# Patient Record
Sex: Female | Born: 1995
Health system: Southern US, Community
[De-identification: ages and names within clinical notes are randomized; demographics above are authoritative.]

## PROBLEM LIST (undated history)

## (undated) DIAGNOSIS — M329 Systemic lupus erythematosus, unspecified: Secondary | ICD-10-CM

## (undated) DIAGNOSIS — I639 Cerebral infarction, unspecified: Secondary | ICD-10-CM

## (undated) DIAGNOSIS — Z862 Personal history of diseases of the blood and blood-forming organs and certain disorders involving the immune mechanism: Secondary | ICD-10-CM

## (undated) HISTORY — DX: Cerebral infarction, unspecified: I63.9

## (undated) HISTORY — PX: HERNIA REPAIR: SHX51

---

## 2018-10-25 ENCOUNTER — Encounter: Payer: Self-pay | Admitting: Hematology and Oncology

## 2018-10-25 ENCOUNTER — Telehealth: Payer: Self-pay | Admitting: *Deleted

## 2018-10-25 ENCOUNTER — Telehealth: Payer: Self-pay | Admitting: Hematology and Oncology

## 2018-10-25 NOTE — Telephone Encounter (Signed)
"  Dr. Lenell Antu [phone]), Rebecca Bright of [facility].  Requesting return call today to speak with hematologist about Rebecca Bright lab results and F/U.  Will not be in office tomorrow.  May call nurse extension [phone] or [phone] if no answer to reach me from clinic."        This nurse received for further information sent via in-basket to on-call provider of the day.  Message left for New Patient Coordinator.   Student has ITP and Lupus. Hematology referral made in September.

## 2018-10-25 NOTE — Telephone Encounter (Signed)
A new patient appt has been scheduled for the pt to see Dr. Pamelia Hoit on 3/5 at 345pm. Pt aware to arrive 30 minutes early. Letter mailed.

## 2018-11-14 NOTE — Progress Notes (Signed)
[place] CONSULT NOTE  Patient Care Team: Patient, No Pcp Per as PCP - General (General Practice)  CHIEF COMPLAINTS/PURPOSE OF CONSULTATION: History of ITP  HISTORY OF PRESENTING ILLNESS:  Rebecca Bright 23 y.o. female is here because of a history of ITP and lupus. She is a Regulatory affairs officer at [employer] and was referred by Dr. Olevia Bowens at [facility]. She presents to the clinic alone today. She was diagnosed with ITP in 10/2014 and was diagnosed with lupus prior to that. In 2016 she went to the [place] and when she returned to [place], where her parents were stationed in [employer], she had lab work done that showed her platelets were at 4,000. She had a bone marrow biopsy and was hospitalized for a week and given IVIG and put on prednisone, followed by Rituxan, which worked for 6 month intervals. She was switched to Dapsone in 2018 and has been on it since. She has had occasional relapses since 2016, where she has been given IVIG and prednisone, and her most recent relapse was 10/24/18 when she ran out of dapsone due to a lack of health insurance.   She moved to [facility] in 2017 when her rheumatologist in the [place] was reassigned and so were her parents. Her relapses occurred at [facility] at [facility]. She has previously been recommended a splenectomy, but is opposed to that due to compromises to the immune system. She denies any side effects of dapsone. She has not had any relapses of skin lupus. She notes petechiae occurs when her platelets are low. She reviewed her medication list with me.   I reviewed her records extensively and collaborated the history with the patient.  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  MEDICATIONS:  Current  Outpatient Medications  Medication Sig Dispense Refill  . dapsone 100 MG tablet Take 1 tablet (100 mg total) by mouth daily. 90 tablet 3   No current facility-administered medications for this visit.     REVIEW OF SYSTEMS:   Constitutional: Denies fevers, chills or abnormal night sweats Eyes: Denies blurriness of vision, double vision or watery eyes Ears, nose, mouth, throat, and face: Denies mucositis or sore throat Respiratory: Denies cough, dyspnea or wheezes Cardiovascular: Denies palpitation, chest discomfort or lower extremity swelling Gastrointestinal:  Denies nausea, heartburn or change in bowel habits Skin: Denies abnormal skin rashes Lymphatics: Denies new lymphadenopathy or easy bruising Neurological:Denies numbness, tingling or new weaknesses Behavioral/Psych: Mood is stable, no new changes  All other systems were reviewed with the patient and are negative.  PHYSICAL EXAMINATION: ECOG PERFORMANCE STATUS: 0 - Asymptomatic  Vitals:   11/15/18 1559  BP: 137/83  Pulse: 72  Resp: 17  Temp: 98.9 F (37.2 C)  SpO2: 100%   Filed Weights   11/15/18 1559  Weight: 80 lb 11.2 oz (36.6 kg)    GENERAL:alert, no distress and comfortable SKIN: skin color, texture, turgor are normal, no rashes or significant lesions EYES: normal, conjunctiva are pink and non-injected, sclera clear OROPHARYNX:no exudate, no erythema and lips, buccal mucosa, and tongue normal  NECK: supple, thyroid normal size, non-tender, without nodularity LYMPH:  no palpable lymphadenopathy in the cervical, axillary or inguinal LUNGS: clear to auscultation and percussion with normal breathing effort HEART: regular rate & rhythm  and no murmurs and no lower extremity edema ABDOMEN:abdomen soft, non-tender and normal bowel sounds Musculoskeletal:no cyanosis of digits and no clubbing  PSYCH: alert & oriented x 3 with fluent speech NEURO: no focal motor/sensory deficits  LABORATORY DATA:  I have reviewed the  data as listed No results found for: WBC, HGB, HCT, MCV, PLT No results found for: NA, K, CL, CO2  RADIOGRAPHIC STUDIES: I have personally reviewed the radiological reports and agreed with the findings in the report.  ASSESSMENT AND PLAN:  History of ITP ITP secondary to SLE Prior treatment: 1.  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  She had a bone marrow biopsy there and was treated with IVIG prednisone and rituximab. 2.  Frequent relapses every 6 months.  She required IVIG prednisone and Rituxan. 3.  Moved to [place] and relapsed 07/06/2015, 02/27/2016 at [facility] 02/27/2016: 4 days of Decadron and IVIG and hydroxyurea for lupus (platelet count of 12) followed by Rituxan 07/06/2015: Hospitalization for ITP: Steroids with IVIG 4.  Patient investigated and found dapsone 100 mg daily which has been preventing relapses of ITP. 5.  10/24/2018: Platelets 49 when she ran out of dapsone. 6.  Last platelet count 217 on 11/15/2018  Plan: Patient wishes to have a hematologist prescribe her dapsone and keep a watch on her platelet count. Her labs are free when they are done at her [place]. I gave her a prescription for CBC with differential which can be done at any time if she were to have petechiae. She will call us if there is any such problem with the lab results.  Return to clinic in 1 year for follow-up   All questions were answered. The patient knows to call the clinic with any problems, questions or concerns.   Nicholas Lose, MD 11/15/2018   I, Cloyde Reams Dorshimer, am acting as scribe for Nicholas Lose, MD.  I have reviewed the above documentation for accuracy and completeness, and I agree with the above.

## 2018-11-15 ENCOUNTER — Inpatient Hospital Stay: Payer: BLUE CROSS/BLUE SHIELD | Attending: Hematology and Oncology | Admitting: Hematology and Oncology

## 2018-11-15 ENCOUNTER — Other Ambulatory Visit: Payer: Self-pay | Admitting: *Deleted

## 2018-11-15 DIAGNOSIS — M329 Systemic lupus erythematosus, unspecified: Secondary | ICD-10-CM | POA: Diagnosis not present

## 2018-11-15 DIAGNOSIS — D693 Immune thrombocytopenic purpura: Secondary | ICD-10-CM | POA: Diagnosis present

## 2018-11-15 DIAGNOSIS — Z862 Personal history of diseases of the blood and blood-forming organs and certain disorders involving the immune mechanism: Secondary | ICD-10-CM

## 2018-11-15 MED ORDER — DAPSONE 100 MG PO TABS: 100.0000 mg | ORAL_TABLET | Freq: Every day | ORAL | 3 refills | Status: DC

## 2018-11-15 NOTE — Assessment & Plan Note (Signed)
ITP secondary to SLE Prior treatment: 1. 02/27/2016: 4 days of Decadron and IVIG and hydroxyurea for lupus (platelet count of 12) followed by Rituxan 2.  07/06/2015: Hospitalization for ITP: Steroids with IVIG  Lab review:

## 2019-01-24 NOTE — Progress Notes (Signed)
HEMATOLOGY-ONCOLOGY DOXIMITY VISIT PROGRESS NOTE  I connected with Rebecca Bright on 01/25/2019 at  9:15 AM EDT by Doximity video conference and verified that I am speaking with the correct person using two identifiers.  I discussed the limitations, risks, security and privacy concerns of performing an evaluation and management service by Doximity and the availability of in person appointments.  I also discussed with the patient that there may be a patient responsible charge related to this service. The patient expressed understanding and agreed to proceed.  Patient's Location: Home Physician Location: Clinic  CHIEF COMPLIANT: Follow-up of ITP and Lupus  INTERVAL HISTORY: Rebecca Bright is a 23 y.o. female with above-mentioned history of ITP and Lupus currently on dapsone. She established care at [place] on 12/11/18. She presents today over Doximity for follow-up.  Patient had blood work done recently which showed platelet count of 38.  We are asked to do a virtual visit with her to discuss her treatment options.  In April the platelet count was 39 and in May it has remained stable at 38.  Dr. Olevia Bowens called me yesterday and we are seeing her today to discuss her treatment options  REVIEW OF SYSTEMS:   Constitutional: Denies fevers, chills or abnormal weight loss Eyes: Denies blurriness of vision Ears, nose, mouth, throat, and face: Denies mucositis or sore throat Respiratory: Denies cough, dyspnea or wheezes Cardiovascular: Denies palpitation, chest discomfort Gastrointestinal:  Denies nausea, heartburn or change in bowel habits Skin: Denies abnormal skin rashes Lymphatics: Denies new lymphadenopathy or easy bruising Neurological:Denies numbness, tingling or new weaknesses Behavioral/Psych: Mood is stable, no new changes  Extremities: No lower extremity edema Breast: denies any pain or lumps or nodules in either breasts All other systems were reviewed with the patient and  are negative.  Observations/Objective:    Assessment Plan:  History of ITP ITP secondary to SLE Prior treatment: 1.  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  She had a bone marrow biopsy there and was treated with IVIG prednisone and rituximab. 2.  Frequent relapses every 6 months.  She required IVIG prednisone and Rituxan. 3.  Moved to [place] and relapsed 07/06/2015, 02/27/2016 at [facility] 02/27/2016: 4 days of Decadron and IVIG and hydroxyurea for lupus (platelet count of 12) followed by Rituxan 07/06/2015: Hospitalization for ITP: Steroids with IVIG 4.  Patient investigated and found dapsone 100 mg daily which has been preventing relapses of ITP. 5.  10/24/2018: Platelets 49 when she ran out of dapsone. 6.  Last platelet count 217 on 11/15/2018  ----------------------------------------------------------------------------------------------------------------------------------------------------------- Current treatment: Dapsone and Plaquenil Labs: 10/24/2018: Platelet count: 49 11/07/2018: Platelet count 264 11/15/2018: Platelet count 217 12/28/2018: Platelet count 39, hemoglobin 10.9, WBC 3.1, ANC 1023, rheumatoid factor positive, SSA antibody positive 01/24/2019: Platelet count 38, WBC count 3.8, hemoglobin 10.8  Relapse of ITP: Since the patient is staying in [place] and her platelet counts are not terrible, I recommended that we start her on dexamethasone and treat her for a week.  She has a prescription from me for a CBC to be drawn locally.  I would like to see her next week through the same virtual visit to determine if any additional treatments like IVIG or Rituxan would be necessary.  I instructed the patient to call us if she has any worsening bleeding symptoms. I sent a prescription for dexamethasone as well as Protonix because previously she had trouble with gastritis  from  dexamethasone.  I discussed the assessment and treatment plan with the patient. The patient was provided an opportunity to ask questions and all were answered. The patient agreed with the plan and demonstrated an understanding of the instructions. The patient was advised to call back or seek an in-person evaluation if the symptoms worsen or if the condition fails to improve as anticipated.   I provided 15 minutes of face-to-face Doximity time during this encounter.    Rulon Eisenmenger, MD 01/25/2019   I, Molly Dorshimer, am acting as scribe for Nicholas Lose, MD.  I have reviewed the above documentation for accuracy and completeness, and I agree with the above.

## 2019-01-25 ENCOUNTER — Inpatient Hospital Stay: Payer: BLUE CROSS/BLUE SHIELD | Attending: Hematology and Oncology | Admitting: Hematology and Oncology

## 2019-01-25 ENCOUNTER — Other Ambulatory Visit: Payer: Self-pay

## 2019-01-25 DIAGNOSIS — Z862 Personal history of diseases of the blood and blood-forming organs and certain disorders involving the immune mechanism: Secondary | ICD-10-CM

## 2019-01-25 DIAGNOSIS — D693 Immune thrombocytopenic purpura: Secondary | ICD-10-CM | POA: Diagnosis not present

## 2019-01-25 MED ORDER — PANTOPRAZOLE SODIUM 40 MG PO TBEC: 40.0000 mg | DELAYED_RELEASE_TABLET | Freq: Every day | ORAL | 0 refills | Status: DC

## 2019-01-25 MED ORDER — DEXAMETHASONE 4 MG PO TABS: 4.0000 mg | ORAL_TABLET | Freq: Every day | ORAL | 0 refills | Status: DC

## 2019-01-25 NOTE — Assessment & Plan Note (Signed)
ITP secondary to SLE Prior treatment: 1.  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  She had a bone marrow biopsy there and was treated with IVIG prednisone and rituximab. 2.  Frequent relapses every 6 months.  She required IVIG prednisone and Rituxan. 3.  Moved to [place] and relapsed 07/06/2015, 02/27/2016 at [facility] 02/27/2016: 4 days of Decadron and IVIG and hydroxyurea for lupus (platelet count of 12) followed by Rituxan 07/06/2015: Hospitalization for ITP: Steroids with IVIG 4.  Patient investigated and found dapsone 100 mg daily which has been preventing relapses of ITP. 5.  10/24/2018: Platelets 49 when she ran out of dapsone. 6.  Last platelet count 217 on 11/15/2018  ----------------------------------------------------------------------------------------------------------------------------------------------------------- Current treatment: Dapsone and Labs: 10/24/2018: Platelet count: 49 11/07/2018: Platelet count 264 11/15/2018: Platelet count 217 12/28/2018: Platelet count 39, hemoglobin 10.9, WBC 3.1, ANC 1023, rheumatoid factor positive, SSA antibody positive 01/24/2019: Platelet count 38, WBC count 3.8, hemoglobin 10.8  Relapse of ITP: I discussed with her that she would probably need IVIG and a 4 days of dexamethasone.

## 2019-01-29 NOTE — Assessment & Plan Note (Signed)
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 She had a bone marrow biopsy there and was treated with IVIG prednisone and rituximab. 2.Frequent relapses every 6 months. She required IVIG prednisone and Rituxan. 3.Moved to [place] and relapsed 07/06/2015, 02/27/2016 at [facility] 02/27/2016: 4 days of Decadron and IVIG and hydroxyurea for lupus(platelet count of 12)followed by Rituxan 07/06/2015: Hospitalization for ITP: Steroids with IVIG 4.Patient investigated and found dapsone 100 mg daily which has been preventing relapses of ITP. 5.10/24/2018: Platelets 49 when she ran out of dapsone. 6.Last platelet count 217 on 11/15/2018  ----------------------------------------------------------------------------------------------------------------------------------------------------------- Current treatment: Dapsone and Plaquenil Labs: 10/24/2018: Platelet count: 49 11/07/2018: Platelet count 264 11/15/2018: Platelet count 217 12/28/2018: Platelet count 39, hemoglobin 10.9, WBC 3.1, ANC 1023, rheumatoid factor positive, SSA antibody positive 01/24/2019: Platelet count 38, WBC count 3.8, hemoglobin 10.8  Relapse of ITP: Current treatment: Dexamethasone 4 mg daily started 01/25/2019  Lab review:  If no response, she will need to come in for IVIG

## 2019-01-31 ENCOUNTER — Telehealth: Payer: Self-pay | Admitting: Hematology and Oncology

## 2019-01-31 NOTE — Progress Notes (Signed)
HEMATOLOGY-ONCOLOGY DOXIMITY VISIT PROGRESS NOTE  I connected with Rebecca Bright on 02/01/2019 at 11:00 AM EDT by Doximity video conference and verified that I am speaking with the correct person using two identifiers.  I discussed the limitations, risks, security and privacy concerns of performing an evaluation and management service by Doximity and the availability of in person appointments.  I also discussed with the patient that there may be a patient responsible charge related to this service. The patient expressed understanding and agreed to proceed.  Patient's Location: Home Physician Location: Clinic  CHIEF COMPLIANT: Follow-up of ITP and Lupus  INTERVAL HISTORY: Rebecca Bright is a 23 y.o. female with above-mentioned history of ITP and Lupus currently on dapsone and dexamethasone after labs on 01/28/19 showed platelet count of 38. She presents today over Doximity for follow-up of dexamethasone treatment and to review recent lab work.  She has epigastric discomfort due to dexamethasone.  She has not had any bruising or bleeding problems. Labs from 01/31/2019 reveal a platelet count of 299.  Hemoglobin 10.6 with an MCV of 82.2.  REVIEW OF SYSTEMS:   Constitutional: Denies fevers, chills or abnormal weight loss Eyes: Denies blurriness of vision Ears, nose, mouth, throat, and face: Denies mucositis or sore throat Respiratory: Denies cough, dyspnea or wheezes Cardiovascular: Denies palpitation, chest discomfort Gastrointestinal: Mild abdominal discomfort due to steroids Skin: Denies abnormal skin rashes Lymphatics: Denies new lymphadenopathy or easy bruising Neurological:Denies numbness, tingling or new weaknesses Behavioral/Psych: Mood is stable, no new changes  Extremities: No lower extremity edema Breast: denies any pain or lumps or nodules in either breasts All other systems were reviewed with the patient and are negative.  Observations/Objective:  There were no vitals filed  for this visit. There is no height or weight on file to calculate BMI.  I have reviewed the data as listed No flowsheet data found.  No results found for: WBC, HGB, HCT, MCV, PLT, NEUTROABS    Assessment Plan:  Acute ITP ([place]) ITP secondary to SLE Prior treatment: 1.Original bleed diagnosed February 2016 clinically when she was stationed there 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 She had a bone marrow biopsy there and was treated with IVIG prednisone and rituximab. 2.Frequent relapses every 6 months. She required IVIG prednisone and Rituxan. 3.Moved to [place] and relapsed 07/06/2015, 02/27/2016 at [facility] 02/27/2016: 4 days of Decadron and IVIG and hydroxyurea for lupus(platelet count of 12)followed by Rituxan 07/06/2015: Hospitalization for ITP: Steroids with IVIG 4.Patient investigated and found dapsone 100 mg daily which has been preventing relapses of ITP. 5.10/24/2018: Platelets 49 when she ran out of dapsone. 6.Last platelet count 217 on 11/15/2018  ----------------------------------------------------------------------------------------------------------------------------------------------------------- Current treatment: Dapsone and Plaquenil Labs: 10/24/2018: Platelet count: 49 11/07/2018: Platelet count 264 11/15/2018: Platelet count 217 12/28/2018: Platelet count 39, hemoglobin 10.9, WBC 3.1, ANC 1023, rheumatoid factor positive, SSA antibody positive 01/24/2019: Platelet count 38, WBC count 3.8, hemoglobin 10.8  Relapse of ITP: Current treatment: Dexamethasone 4 mg daily started 01/25/2019  Lab review: 01/31/2019: Platelets 299  Since she had a fantastic response to dexamethasone, we will taper off dexamethasone 3 mg for a week 2 mg for a week 1 mg for a week and half a milligram for another week.  So she will taper off over 4 weeks.  I plan to see her back to a Doximity video call in 2 weeks with labs done ahead of time at [facility] in [place].  We will repeat labs 2 weeks from  that point and then once a month  I discussed the assessment and treatment plan with the patient. The patient was provided an opportunity to ask questions and all were answered. The patient agreed with the plan and demonstrated an understanding of the instructions. The patient was advised to call back or seek an in-person evaluation if the symptoms worsen or if the condition fails to improve as anticipated.   I provided 15 minutes of face-to-face Doximity time during this encounter.    Rulon Eisenmenger, MD 02/01/2019   I, Molly Dorshimer, am acting as scribe for Nicholas Lose, MD.  I have reviewed the above documentation for accuracy and completeness, and I agree with the above.

## 2019-01-31 NOTE — Telephone Encounter (Signed)
Contacted pt regarding webex appt and pre reg

## 2019-02-01 ENCOUNTER — Inpatient Hospital Stay (HOSPITAL_BASED_OUTPATIENT_CLINIC_OR_DEPARTMENT_OTHER): Payer: BLUE CROSS/BLUE SHIELD | Admitting: Hematology and Oncology

## 2019-02-01 DIAGNOSIS — D693 Immune thrombocytopenic purpura: Secondary | ICD-10-CM | POA: Diagnosis not present

## 2019-02-06 ENCOUNTER — Telehealth: Payer: Self-pay

## 2019-02-06 ENCOUNTER — Other Ambulatory Visit: Payer: Self-pay

## 2019-02-06 NOTE — Telephone Encounter (Signed)
Patient called stating pharmacy never received RX for dexamethasone taper prescription.   RN called in taper pack for dexamethasone per Dr. Earmon Phoenix recommendations.    Pt aware.  No further needs.

## 2019-02-18 ENCOUNTER — Other Ambulatory Visit: Payer: Self-pay | Admitting: Hematology and Oncology

## 2019-02-21 ENCOUNTER — Encounter: Payer: Self-pay | Admitting: Hematology and Oncology

## 2019-05-15 ENCOUNTER — Other Ambulatory Visit: Payer: Self-pay | Admitting: Emergency Medicine

## 2019-05-15 DIAGNOSIS — Z20822 Contact with and (suspected) exposure to covid-19: Secondary | ICD-10-CM

## 2019-05-16 LAB — NOVEL CORONAVIRUS, NAA: SARS-CoV-2, NAA: NOT DETECTED

## 2019-05-17 ENCOUNTER — Telehealth: Payer: Self-pay | Admitting: *Deleted

## 2019-05-17 ENCOUNTER — Telehealth: Payer: Self-pay

## 2019-05-17 NOTE — Telephone Encounter (Signed)
Records faxed to [facility] - Release [id number]

## 2019-05-17 NOTE — Telephone Encounter (Signed)
FYI "Dr. Olevia Bowens, [facility] [phone]).   Calling to discuss mutual patient Rebecca Bright with Dr. Lindi Adie.   ITP and Lupus.  Labs for rheumatologist resulted yesterday.   Pltc = 17.   She reports given Prednisone, Plaquenil and Dapsone but ran out of Prednisone."  Paged on-call provider to return call.

## 2019-05-17 NOTE — Telephone Encounter (Signed)
I discussed with Dr. Olevia Bowens, message sent to Dr. Lindi Adie

## 2019-05-17 NOTE — Telephone Encounter (Signed)
RN spoke with patient to schedule follow up with labs per Dr. Gearldine Shown recommendations.   Pt reports she currently had a COVID 19 test that was negative, however was still encouraged to quarantine for 2 weeks.    RN scheduled virtual visit for 9/8.  Pt will have Dr. Hansel Starling staff fax over copy of recent CBC.

## 2019-05-19 NOTE — Progress Notes (Signed)
HEMATOLOGY-ONCOLOGY DOXIMITY VISIT PROGRESS NOTE  I connected with Rebecca Bright on 05/21/2019 at  2:30 PM EDT by Doximity video conference and verified that I am speaking with the correct person using two identifiers.  I discussed the limitations, risks, security and privacy concerns of performing an evaluation and management service by Doximity and the availability of in person appointments.  I also discussed with the patient that there may be a patient responsible charge related to this service. The patient expressed understanding and agreed to proceed.  Patient's Location: Home Physician Location: Clinic  CHIEF COMPLIANT: Follow-up of ITP and Lupus  INTERVAL HISTORY: Rebecca Bright is a 23 y.o. female with above-mentioned history of ITP and Lupus currently on dapsone and dexamethasone. Labs on 05/16/19 at her rheumatologist showed platelets 17 and patient reported she had run out of dexamethasone. She presents today over Doximity for follow-up to review recent lab work.   REVIEW OF SYSTEMS:   Constitutional: Denies fevers, chills or abnormal weight loss Eyes: Denies blurriness of vision Ears, nose, mouth, throat, and face: Denies mucositis or sore throat Respiratory: Denies cough, dyspnea or wheezes Cardiovascular: Denies palpitation, chest discomfort Gastrointestinal:  Denies nausea, heartburn or change in bowel habits Skin: Denies abnormal skin rashes Lymphatics: Denies new lymphadenopathy or easy bruising Neurological:Denies numbness, tingling or new weaknesses Behavioral/Psych: Mood is stable, no new changes  Extremities: No lower extremity edema Breast: denies any pain or lumps or nodules in either breasts All other systems were reviewed with the patient and are negative.  Observations/Objective:  There were no vitals filed for this visit. There is no height or weight on file to calculate BMI.  I have reviewed the data as listed No flowsheet data found.  No results found  for: WBC, HGB, HCT, MCV, PLT, NEUTROABS    Assessment Plan:  Acute ITP ([place]) ITP secondary to SLE Prior treatment: 1.Original bleed diagnosed February 2016 clinically when she was stationed there 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 She had a bone marrow biopsy there and was treated with IVIG prednisone and rituximab. 2.Frequent relapses every 6 months. She required IVIG prednisone and Rituxan. 3.Moved to [place] and relapsed 07/06/2015, 02/27/2016 at [facility] 02/27/2016: 4 days of Decadron and IVIG and hydroxyurea for lupus(platelet count of 12)followed by Rituxan 07/06/2015: Hospitalization for ITP: Steroids with IVIG 4.Patient investigated and found dapsone 100 mg daily which has been preventing relapses of ITP. 5.10/24/2018: Platelets 49 when she ran out of dapsone. 6.Relapse 01/27/2019: Dexamethasone went into remission 7.  05/17/2019: Relapse platelet count 17 ----------------------------------------------------------------------------------------------------------------------------------------------------------- Current treatment: Dapsone andPlaquenil Dexamethasone started 01/25/2019: Remarkable response, tapered off.  Relapse: 05/17/2019: Platelet 17 Plan: Restart dexamethasone 4 mg daily for 1 week and then she will taper it off in the next 2 months. Because of such frequent relapses and remissions, we discussed different options and I recommended that we start Promacta.   Other options discussed include fostamatinib and Nplate  She will take 50 mg a day and I sent the prescription to [facility] long pharmacy to look at cost and other options.  I would like her to get labs done every other week for 2 months and then once a month for another 4 months.  She will do this at her student health center in [place].  We will do a video call in 4 weeks to go over the results and the plan.  I discussed the assessment and treatment plan with the  patient. The patient was provided an opportunity  to ask questions and all were answered. The patient agreed with the plan and demonstrated an understanding of the instructions. The patient was advised to call back or seek an in-person evaluation if the symptoms worsen or if the condition fails to improve as anticipated.   I provided 25 minutes of face-to-face Doximity time during this encounter.    Rulon Eisenmenger, MD 05/21/2019   I, Molly Dorshimer, am acting as scribe for Nicholas Lose, MD.  I have reviewed the above documentation for accuracy and completeness, and I agree with the above.

## 2019-05-21 ENCOUNTER — Inpatient Hospital Stay: Payer: BC Managed Care – PPO | Attending: Hematology and Oncology | Admitting: Hematology and Oncology

## 2019-05-21 DIAGNOSIS — D693 Immune thrombocytopenic purpura: Secondary | ICD-10-CM

## 2019-05-21 MED ORDER — ELTROMBOPAG OLAMINE 50 MG PO TABS: 50.0000 mg | ORAL_TABLET | Freq: Every day | ORAL | 6 refills | Status: DC

## 2019-05-21 MED ORDER — DEXAMETHASONE 4 MG PO TABS: 4.0000 mg | ORAL_TABLET | Freq: Every day | ORAL | 0 refills | Status: DC

## 2019-05-21 MED ORDER — DEXAMETHASONE 1 MG PO TABS: 4.0000 mg | ORAL_TABLET | Freq: Two times a day (BID) | ORAL | 0 refills | Status: DC

## 2019-05-21 NOTE — Assessment & Plan Note (Signed)
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 She had a bone marrow biopsy there and was treated with IVIG prednisone and rituximab. 2.Frequent relapses every 6 months. She required IVIG prednisone and Rituxan. 3.Moved to [place] and relapsed 07/06/2015, 02/27/2016 at [facility] 02/27/2016: 4 days of Decadron and IVIG and hydroxyurea for lupus(platelet count of 12)followed by Rituxan 07/06/2015: Hospitalization for ITP: Steroids with IVIG 4.Patient investigated and found dapsone 100 mg daily which has been preventing relapses of ITP. 5.10/24/2018: Platelets 49 when she ran out of dapsone. 6.Last platelet count 217 on 11/15/2018 ----------------------------------------------------------------------------------------------------------------------------------------------------------- Current treatment: Dapsone andPlaquenil Dexamethasone started 01/25/2019: Remarkable response, tapered off.  Relapse: 05/17/2019: Platelet 17 Plan: Restart dexamethasone

## 2019-05-22 ENCOUNTER — Telehealth: Payer: Self-pay | Admitting: Pharmacist

## 2019-05-22 ENCOUNTER — Telehealth: Payer: Self-pay

## 2019-05-22 ENCOUNTER — Telehealth: Payer: Self-pay | Admitting: Hematology and Oncology

## 2019-05-22 DIAGNOSIS — M329 Systemic lupus erythematosus, unspecified: Secondary | ICD-10-CM

## 2019-05-22 MED ORDER — DEXAMETHASONE 1 MG PO TABS: 4.0000 mg | ORAL_TABLET | Freq: Two times a day (BID) | ORAL | 0 refills | Status: DC

## 2019-05-22 MED ORDER — DEXAMETHASONE 4 MG PO TABS: 4.0000 mg | ORAL_TABLET | Freq: Every day | ORAL | 0 refills | Status: DC

## 2019-05-22 NOTE — Telephone Encounter (Signed)
I talk with patient about 10/6

## 2019-05-22 NOTE — Telephone Encounter (Signed)
Oral Oncology Patient Advocate Encounter  Received notification from [facility] that prior authorization for Promacta is required.  PA submitted on CoverMyMeds Key [id number] Status is pending  Oral Oncology Clinic will continue to follow.  [place] Patient [place] Phone [phone] Fax [phone] 05/22/2019    8:47 AM

## 2019-05-22 NOTE — Telephone Encounter (Signed)
Oral Chemotherapy Pharmacist Encounter   I spoke with patient for overview of: Promacta (eltrombopag) for the treatment of immune thrombocytopenia (ITP), planned duration until adequate platelet count response or unacceptable toxicity.   Counseled patient on administration, dosing, side effects, monitoring, drug-food interactions, safe handling, storage, and disposal.  Patient will take Promacta 50mg  tablets, 1 tablet by mouth once daily on an empty stomach, 1 hour before or 2 hours after a meal.  Patient counseled to administer Promacta at least 2 hours before, or 4 hours after antacids, foods high in calcium, or vitamin supplements (iron, calcium, aluminum, magnesium, selinium, zinc).  Promacta start date: 05/25/19  Adverse effects include but are not limited to: fatigue, diarrhea, nausea, pruritis, decreased blood counts, hepatotoxicity, flu-like symptoms, myalgias, fever, and peripheral edema.    Reviewed with patient importance of keeping a medication schedule and plan for any missed doses.  Medication reconciliation performed and medication/allergy list updated. Dexamethasone prescriptions sent to [facility] per patient's request.  Insurance authorization for [name] has been obtained. Test claim at the pharmacy revealed copayment $120 for 1st fill of Promacta. Oral oncology patient advocate has secured manufacturer copayment coupon to reduce out of pocket expense to $20 per fill.  This will ship from the [place] on 05/23/19 to deliver to patient's home on 9/11.  Patient informed the pharmacy will reach out 5-7 days prior to needing next fill of Promacta to coordinate continued medication acquisition to prevent break in therapy.  All questions answered.  Ms. Foulks voiced understanding and appreciation.   Patient knows to call the office with questions or concerns.  Johny Drilling, PharmD, BCPS, BCOP  05/22/2019   2:14 PM Oral Oncology  Clinic [phone]

## 2019-05-22 NOTE — Telephone Encounter (Signed)
Opened in error

## 2019-05-22 NOTE — Telephone Encounter (Signed)
Oral Oncology Patient Advocate Encounter  Prior Authorization for Rebecca Bright has been approved.    PA# [id number] Effective dates: 05/22/19 through 07/16/19  Oral Oncology Clinic will continue to follow.   [place] Patient [name] Phone [phone] Fax [phone] 05/22/2019    8:47 AM

## 2019-05-22 NOTE — Telephone Encounter (Signed)
Oral Oncology Pharmacist Encounter  Received new prescription for Promacta (eltrombopag) for the treatment of immune thrombocytopenia (ITP), planned duration until adequate platelet count response or unacceptable toxicity.  Originally diagnosed in 2016 with systemic lupus erythematosus and ITP Patient has been previously treated with IVIG, steroids, and rituximab, with recent relapse of disease She is maintained on dapsone and hydroxychloroquine for her SLE She is now under evaluation to initiate eltrombopag at 50 mg once daily for her ITP  Last labs I can find in Epic from 02/21/2019, no evidence of renal or hepatic dysfunction  Current medication list in Epic reviewed, no DDIs with eltrombopag identified.  Prescription has been e-scribed to the [facility] for benefits analysis and approval on 05/21/19 by MD.  Oral Oncology Clinic will continue to follow for insurance authorization, copayment issues, initial counseling and start date.  Johny Drilling, PharmD, BCPS, BCOP  05/22/2019 8:01 AM Oral Oncology Clinic [phone]

## 2019-05-23 MED FILL — PROMACTA 50 MG TABLET: 50 | 30 days supply | Qty: 30 | Fill #0

## 2019-05-24 NOTE — Telephone Encounter (Signed)
Oral Oncology Patient Advocate Encounter  Confirmed with [place] that Rebecca Bright was shipped on 9/10 with a $20 copay.   [place] Patient Rebecca Bright Phone [phone] Fax [phone] 05/24/2019   9:30 AM

## 2019-06-17 ENCOUNTER — Telehealth: Payer: Self-pay | Admitting: Hematology and Oncology

## 2019-06-17 MED FILL — PROMACTA 50 MG TABLET: 50 | 30 days supply | Qty: 30 | Fill #1

## 2019-06-17 NOTE — Progress Notes (Signed)
HEMATOLOGY-ONCOLOGY DOXIMITY VISIT PROGRESS NOTE  I connected with [name] on 06/18/2019 at  2:00 PM EDT by Doximityconference and verified that I am speaking with the correct person using two identifiers.  I discussed the limitations, risks, security and privacy concerns of performing an evaluation and management service by Doximity and the availability of in person appointments.  I also discussed with the patient that there may be a patient responsible charge related to this service. The patient expressed understanding and agreed to proceed.  Patient's Location: Home Physician Location: Clinic  CHIEF COMPLIANT: Follow-up of ITP and Lupus on Promacta  INTERVAL HISTORY: Rebecca Bright is a 23 y.o. female with above-mentioned history of ITP and Lupus currently on Promacta, started 05/25/19. She presents today over Doximity for follow-up.  Patient has been taking Promacta but has not done blood work because she was not feeling up to it.  She plans to go to her campus tomorrow and she will do blood work then.  We hope to receive the blood work the same day.  We do not know if the treatment is working at this time.  She has not had any problems tolerating Promacta.  REVIEW OF SYSTEMS:   Constitutional: Denies fevers, chills or abnormal weight loss Eyes: Denies blurriness of vision Ears, nose, mouth, throat, and face: Denies mucositis or sore throat Respiratory: Denies cough, dyspnea or wheezes Cardiovascular: Denies palpitation, chest discomfort Gastrointestinal:  Denies nausea, heartburn or change in bowel habits Skin: Denies abnormal skin rashes Lymphatics: Denies new lymphadenopathy or easy bruising Neurological:Denies numbness, tingling or new weaknesses Behavioral/Psych: Mood is stable, no new changes  Extremities: No lower extremity edema Breast: denies any pain or lumps or nodules in either breasts All other systems were reviewed with the patient and are  negative.  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 She had a bone marrow biopsy there and was treated with IVIG prednisone and rituximab. 2.Frequent relapses every 6 months. She required IVIG prednisone and Rituxan. 3.Moved to [place] and relapsed 07/06/2015, 02/27/2016 at [facility] 02/27/2016: 4 days of Decadron and IVIG and hydroxyurea for lupus(platelet count of 12)followed by Rituxan 07/06/2015: Hospitalization for ITP: Steroids with IVIG 4.Patient investigated and found dapsone 100 mg daily which has been preventing relapses of ITP. 5.10/24/2018: Platelets 49 when she ran out of dapsone. 6.Relapse 01/27/2019: Dexamethasone went into remission 7.  05/17/2019: Relapse platelet count 17 ----------------------------------------------------------------------------------------------------------------------------------------------------------- Relapse: 05/17/2019: Platelet 17 Plan: Dexamethasone 4 mg daily with Promacta 50 mg daily  She did not get her blood work done yet at her campus.  She will do it tomorrow.  She will do this at her student health center in [place].  Once I see the results we can discuss about treatment. We will perform a telephone visit after tomorrow.    I discussed the assessment and treatment plan with the patient. The patient was provided an opportunity to ask questions and all were answered. The patient agreed with the plan and demonstrated an understanding of the instructions. The patient was advised to call back or seek an in-person evaluation if the symptoms worsen or if the condition fails to improve as anticipated.   I provided 11 minutes of non face-to-face Doximity time during this encounter.    Rulon Eisenmenger, MD 06/18/2019  I, Molly Dorshimer, am acting as scribe for Nicholas Lose, MD.  I have reviewed the above documentation for accuracy and completeness, and I agree with the above.

## 2019-06-17 NOTE — Telephone Encounter (Signed)
Confirmed Doximity appt for 10/6 and verified pt's demographics

## 2019-06-18 ENCOUNTER — Inpatient Hospital Stay: Payer: BC Managed Care – PPO | Attending: Hematology and Oncology | Admitting: Hematology and Oncology

## 2019-06-18 DIAGNOSIS — D693 Immune thrombocytopenic purpura: Secondary | ICD-10-CM

## 2019-06-18 NOTE — Assessment & Plan Note (Signed)
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 She had a bone marrow biopsy there and was treated with IVIG prednisone and rituximab. 2.Frequent relapses every 6 months. She required IVIG prednisone and Rituxan. 3.Moved to [place] and relapsed 07/06/2015, 02/27/2016 at [facility] 02/27/2016: 4 days of Decadron and IVIG and hydroxyurea for lupus(platelet count of 12)followed by Rituxan 07/06/2015: Hospitalization for ITP: Steroids with IVIG 4.Patient investigated and found dapsone 100 mg daily which has been preventing relapses of ITP. 5.10/24/2018: Platelets 49 when she ran out of dapsone. 6.Relapse 01/27/2019: Dexamethasone went into remission 7.  05/17/2019: Relapse platelet count 17 ----------------------------------------------------------------------------------------------------------------------------------------------------------- Current treatment: Dapsone andPlaquenil Dexamethasone started 01/25/2019: Remarkable response, tapered off.  Relapse: 05/17/2019: Platelet 17 Plan: Dexamethasone 4 mg daily with Promacta 50 mg daily  I would like her to get labs done every other week for 2 months and then once a month for another 4 months.  She will do this at her student health center in [place]

## 2019-06-19 ENCOUNTER — Telehealth: Payer: Self-pay | Admitting: Hematology and Oncology

## 2019-06-19 NOTE — Telephone Encounter (Signed)
I LEFT  A message regarding phone visit  

## 2019-06-20 ENCOUNTER — Telehealth: Payer: Self-pay | Admitting: Hematology and Oncology

## 2019-06-20 ENCOUNTER — Inpatient Hospital Stay: Payer: BC Managed Care – PPO | Admitting: Hematology and Oncology

## 2019-06-20 NOTE — Telephone Encounter (Signed)
Verified phone visit for 10/9 and verified demographics

## 2019-06-20 NOTE — Progress Notes (Signed)
HEMATOLOGY-ONCOLOGY TELEPHONE VISIT PROGRESS NOTE  I connected with Rebecca Bright on 06/21/2019 at  8:30 AM EDT by telephone and verified that I am speaking with the correct person using two identifiers.  I discussed the limitations, risks, security and privacy concerns of performing an evaluation and management service by telephone and the availability of in person appointments.  I also discussed with the patient that there may be a patient responsible charge related to this service. The patient expressed understanding and agreed to proceed.   History of Present Illness: Rebecca Bright is a 23 y.o. female with above-mentioned history of ITP and Lupus currently onPromacta, started 05/25/19. She presents today over the phone today for follow-up to review her labs.  She does not have any bruising or bleeding symptoms. Observations/Objective:  No concerns for bleeding   Assessment Plan:  Acute ITP ([place]) ITP secondary to SLE Prior treatment: 1.Original bleed diagnosed February 2016 clinically when she was stationed there 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 She had a bone marrow biopsy there and was treated with IVIG prednisone and rituximab. 2.Frequent relapses every 6 months. She required IVIG prednisone and Rituxan. 3.Moved to [place] and relapsed 07/06/2015, 02/27/2016 at [facility] 02/27/2016: 4 days of Decadron and IVIG and hydroxyurea for lupus(platelet count of 12)followed by Rituxan 07/06/2015: Hospitalization for ITP: Steroids with IVIG 4.Patient investigated and found dapsone 100 mg daily which has been preventing relapses of ITP. 5.10/24/2018: Platelets 49 when she ran out of dapsone. 6.Relapse 01/27/2019: Dexamethasone went into remission 7.05/17/2019: Relapse platelet count 17 ----------------------------------------------------------------------------------------------------------------------------------------------------------- Relapse:  05/17/2019: Platelet 17 Current treatment: Dexamethasone4 mg daily with Promacta 50 mg daily  Lab review at [facility]: 06/20/2019: Platelet count 788 Plan: 1.  Start tapering dexamethasone for the next 3 weeks 2.  Stop Promacta until platelet count drops below 150 and then resume Promacta at 25 mg dose.  If in 2 weeks platelet counts are still above 400 then we may have to discontinue Promacta. 3.  She will discontinue dapsone as well. I will touched base with her in 2 weeks to review the results of blood work.  I discussed the assessment and treatment plan with the patient. The patient was provided an opportunity to ask questions and all were answered. The patient agreed with the plan and demonstrated an understanding of the instructions. The patient was advised to call back or seek an in-person evaluation if the symptoms worsen or if the condition fails to improve as anticipated.   I provided 15 minutes of non-face-to-face time during this encounter.   Rulon Eisenmenger, MD 06/21/2019    I, Molly Dorshimer, am acting as scribe for Nicholas Lose, MD.  I have reviewed the above documentation for accuracy and completeness, and I agree with the above.

## 2019-06-20 NOTE — Telephone Encounter (Signed)
Returned patient's phone call, patient confirmed date and time.

## 2019-06-21 ENCOUNTER — Inpatient Hospital Stay: Payer: BC Managed Care – PPO | Admitting: Hematology and Oncology

## 2019-06-21 DIAGNOSIS — D693 Immune thrombocytopenic purpura: Secondary | ICD-10-CM

## 2019-06-21 NOTE — Assessment & Plan Note (Signed)
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 She had a bone marrow biopsy there and was treated with IVIG prednisone and rituximab. 2.Frequent relapses every 6 months. She required IVIG prednisone and Rituxan. 3.Moved to [place] and relapsed 07/06/2015, 02/27/2016 at [facility] 02/27/2016: 4 days of Decadron and IVIG and hydroxyurea for lupus(platelet count of 12)followed by Rituxan 07/06/2015: Hospitalization for ITP: Steroids with IVIG 4.Patient investigated and found dapsone 100 mg daily which has been preventing relapses of ITP. 5.10/24/2018: Platelets 49 when she ran out of dapsone. 6.Relapse 01/27/2019: Dexamethasone went into remission 7.05/17/2019: Relapse platelet count 17 ----------------------------------------------------------------------------------------------------------------------------------------------------------- Relapse: 05/17/2019: Platelet 17 Current treatment: Dexamethasone4 mg daily with Promacta 50 mg daily  Lab review at [facility]:

## 2019-06-24 ENCOUNTER — Telehealth: Payer: Self-pay | Admitting: Hematology and Oncology

## 2019-06-24 NOTE — Telephone Encounter (Signed)
I TALK WITH PATIENT

## 2019-07-01 ENCOUNTER — Inpatient Hospital Stay: Payer: BC Managed Care – PPO | Admitting: Hematology and Oncology

## 2019-07-10 NOTE — Progress Notes (Signed)
HEMATOLOGY-ONCOLOGY DOXIMITY VISIT PROGRESS NOTE  I connected with [name] on 07/11/2019 at  8:15 AM EDT by Doximity video conference and verified that I am speaking with the correct person using two identifiers.  I discussed the limitations, risks, security and privacy concerns of performing an evaluation and management service by Doximity and the availability of in person appointments.  I also discussed with the patient that there may be a patient responsible charge related to this service. The patient expressed understanding and agreed to proceed.  Patient's Location: Home Physician Location: Clinic  CHIEF COMPLIANT: Follow-up of ITP and Lupus to review labs   INTERVAL HISTORY: Rebecca Bright is a 23 y.o. female with above-mentioned history of  ITP and Lupus currently onPromacta, started 05/25/19. She presents today overDoximity todayfor follow-up to review her labs.   REVIEW OF SYSTEMS:   Constitutional: Denies fevers, chills or abnormal weight loss Eyes: Denies blurriness of vision Ears, nose, mouth, throat, and face: Denies mucositis or sore throat Respiratory: Denies cough, dyspnea or wheezes Cardiovascular: Denies palpitation, chest discomfort Gastrointestinal:  Denies nausea, heartburn or change in bowel habits Skin: Denies abnormal skin rashes Lymphatics: Denies new lymphadenopathy or easy bruising Neurological:Denies numbness, tingling or new weaknesses Behavioral/Psych: Mood is stable, no new changes  Extremities: No lower extremity edema Breast: denies any pain or lumps or nodules in either breasts All other systems were reviewed with the patient and are negative.  Observations/Objective:  There were no vitals filed for this visit. There is no height or weight on file to calculate BMI.  I have reviewed the data as listed No flowsheet data found.  No results found for: WBC, HGB, HCT, MCV, PLT, NEUTROABS    Assessment Plan:  Acute ITP ([place]) ITP secondary  to SLE Prior treatment: 1.Original bleed diagnosed February 2016 clinically when she was stationed there 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 She had a bone marrow biopsy there and was treated with IVIG prednisone and rituximab. 2.Frequent relapses every 6 months. She required IVIG prednisone and Rituxan. 3.Moved to [place] and relapsed 07/06/2015, 02/27/2016 at [facility] 02/27/2016: 4 days of Decadron and IVIG and hydroxyurea for lupus(platelet count of 12)followed by Rituxan 07/06/2015: Hospitalization for ITP: Steroids with IVIG 4.Patient investigated and found dapsone 100 mg daily which has been preventing relapses of ITP. 5.10/24/2018: Platelets 49 when she ran out of dapsone. 6.Relapse 01/27/2019: Dexamethasone went into remission 7.05/17/2019: Relapse platelet count 17 ----------------------------------------------------------------------------------------------------------------------------------------------------------- Relapse: 05/17/2019: Platelet 17 Current treatment:Dexamethasone4 mg dailywith Promacta 50 mg daily  Lab review at [facility]:  06/20/2019: Platelet count 788 07/10/2019: Platelet count 404  Plan: 1.  Tapering dexamethasone 2.  Stop Promacta until platelet count drops below 150 and then resume Promacta at 25 mg dose.    I will touched base with her in 3 weeks to review the results of blood work.    I discussed the assessment and treatment plan with the patient. The patient was provided an opportunity to ask questions and all were answered. The patient agreed with the plan and demonstrated an understanding of the instructions. The patient was advised to call back or seek an in-person evaluation if the symptoms worsen or if the condition fails to improve as anticipated.   I provided 11 minutes of face-to-face Doximity time during this encounter.    Rulon Eisenmenger, MD 07/11/2019   I, Molly Dorshimer, am acting as scribe  for Nicholas Lose, MD.  I have reviewed the above documentation for accuracy and  completeness, and I agree with the above.

## 2019-07-10 NOTE — Assessment & Plan Note (Signed)
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 She had a bone marrow biopsy there and was treated with IVIG prednisone and rituximab. 2.Frequent relapses every 6 months. She required IVIG prednisone and Rituxan. 3.Moved to [place] and relapsed 07/06/2015, 02/27/2016 at [facility] 02/27/2016: 4 days of Decadron and IVIG and hydroxyurea for lupus(platelet count of 12)followed by Rituxan 07/06/2015: Hospitalization for ITP: Steroids with IVIG 4.Patient investigated and found dapsone 100 mg daily which has been preventing relapses of ITP. 5.10/24/2018: Platelets 49 when she ran out of dapsone. 6.Relapse 01/27/2019: Dexamethasone went into remission 7.05/17/2019: Relapse platelet count 17 ----------------------------------------------------------------------------------------------------------------------------------------------------------- Relapse: 05/17/2019: Platelet 17 Current treatment:Dexamethasone4 mg dailywith Promacta 50 mg daily  Lab review at [facility]:  06/20/2019: Platelet count 788 07/10/2019: Platelet count  Plan: 1.  Tapering dexamethasone 2.  Stop Promacta until platelet count drops below 150 and then resume Promacta at 25 mg dose.  If in 2 weeks platelet counts are still above 400 then we may have to discontinue Promacta.  I will touched base with her in 2 weeks to review the results of blood work.

## 2019-07-11 ENCOUNTER — Inpatient Hospital Stay (HOSPITAL_BASED_OUTPATIENT_CLINIC_OR_DEPARTMENT_OTHER): Payer: BC Managed Care – PPO | Admitting: Hematology and Oncology

## 2019-07-11 DIAGNOSIS — D693 Immune thrombocytopenic purpura: Secondary | ICD-10-CM

## 2019-07-12 ENCOUNTER — Telehealth: Payer: Self-pay | Admitting: Hematology and Oncology

## 2019-07-12 NOTE — Telephone Encounter (Signed)
Scheduled appt per 10/29 LOS - unable to reach pt - left message with appt date and time

## 2019-07-15 ENCOUNTER — Telehealth: Payer: Self-pay

## 2019-07-15 NOTE — Telephone Encounter (Signed)
Pt called to report increase in bruising and what she quoted "[place]".  Patient denies any bleeding in nares, gums, or stool.    RN reviewed with MD.  MD recommendations to obtain CBC and follow up in person.   Pt gets labs drawn at [facility], and is scheduled for 11/3 @ 9:40am.  Follow up with MD is scheduled for 11/3 @ 10:30.    RN educated patient on thrombocytopenia precautions.  Pt voiced understanding.

## 2019-07-15 NOTE — Progress Notes (Signed)
Patient Care Team: Keith Rake, MD as PCP - General (Family Medicine)  DIAGNOSIS:    ICD-10-CM   1. Acute ITP (HCC)  D69.3   2. Lupus (HCC)  M32.9 dexamethasone (DECADRON) 1 MG tablet    CHIEF COMPLIANT: Follow-up of ITP and Lupus for recent bruising and petechiae  INTERVAL HISTORY: Rebecca Bright is a 23 y.o. with above-mentioned history of ITP and Lupus currently onPromacta, started 05/25/19, and dexamethasone. She presents to the clinic today for follow-up of recent increased bruising and petechiae.  Her platelet count at the [facility] was 3.  She has noticed bleeding gums and extensive petechiae and bruising.  REVIEW OF SYSTEMS:   Constitutional: Denies fevers, chills or abnormal weight loss Eyes: Denies blurriness of vision Ears, nose, mouth, throat, and face: Bleeding mucosa in the mouth Respiratory: Denies cough, dyspnea or wheezes Cardiovascular: Denies palpitation, chest discomfort Gastrointestinal: Denies nausea, heartburn or change in bowel habits Skin: Denies abnormal skin rashes Lymphatics: Denies new lymphadenopathy or easy bruising Neurological: Denies numbness, tingling or new weaknesses Behavioral/Psych: Mood is stable, no new changes  Extremities: Extensive bruises on extremities Breast: denies any pain or lumps or nodules in either breasts All other systems were reviewed with the patient and are negative.  I have reviewed the past medical history, past surgical history, social history and family history with the patient and they are unchanged from previous note.  ALLERGIES:  has no allergies on file.  MEDICATIONS:  Current Outpatient Medications  Medication Sig Dispense Refill   dexamethasone (DECADRON) 1 MG tablet Take 4 tablets (4 mg total) by mouth 2 (two) times daily with a meal. 100 tablet 0   eltrombopag (PROMACTA) 50 MG tablet Take 0.5 tablets (25 mg total) by mouth daily. Take on an empty stomach 1 hour before a meal or 2 hours after 30 tablet 6    hydroxychloroquine (PLAQUENIL) 200 MG tablet Take 1 tablet (200 mg total) by mouth daily.     No current facility-administered medications for this visit.     PHYSICAL EXAMINATION: ECOG PERFORMANCE STATUS: 1 - Symptomatic but completely ambulatory  Vitals:   07/16/19 1049  BP: 135/86  Pulse: 75  Resp: 18  Temp: 98.3 F (36.8 C)  SpO2: 100%   Filed Weights   07/16/19 1049  Weight: 95 lb 3.2 oz (43.2 kg)    GENERAL: alert, no distress and comfortable SKIN: skin color, texture, turgor are normal, no rashes or significant lesions EYES: normal, Conjunctiva are pink and non-injected, sclera clear OROPHARYNX: no exudate, no erythema and lips, buccal mucosa, and tongue normal  NECK: supple, thyroid normal size, non-tender, without nodularity LYMPH: no palpable lymphadenopathy in the cervical, axillary or inguinal LUNGS: clear to auscultation and percussion with normal breathing effort HEART: regular rate & rhythm and no murmurs and no lower extremity edema ABDOMEN: abdomen soft, non-tender and normal bowel sounds MUSCULOSKELETAL: no cyanosis of digits and no clubbing  NEURO: alert & oriented x 3 with fluent speech, no focal motor/sensory deficits EXTREMITIES: No lower extremity edema  ASSESSMENT & PLAN:  Acute ITP (HCC) ITP secondary to SLE Prior treatment: 1.Original bleed diagnosed February 2016 clinically when she was stationed there 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 She had a bone marrow biopsy there and was treated with IVIG prednisone and rituximab. 2.Frequent relapses every 6 months. She required IVIG prednisone and Rituxan. 3.Moved to [place] and relapsed 07/06/2015, 02/27/2016 at [facility] 02/27/2016: 4 days of Decadron and IVIG and  hydroxyurea for lupus(platelet count of 12)followed by Rituxan 07/06/2015: Hospitalization for ITP: Steroids with IVIG 4.Patient investigated and found dapsone 100 mg daily which has been preventing  relapses of ITP. 5.10/24/2018: Platelets 49 when she ran out of dapsone. 6.Relapse 01/27/2019: Dexamethasone went into remission 7.05/17/2019: Relapse platelet count 17 ----------------------------------------------------------------------------------------------------------------------------------------------------------- Relapse: 05/17/2019: Platelet 17 Current treatment:Dexamethasone4 mg dailywith Promacta 50 mg daily held 06/20/2019 when platelet counts were 788  Lab review at [facility]: 06/20/2019: Platelet count 788 07/10/2019: Platelet count 404 07/15/2019: Platelet count 3  Plan: Restart Promacta 25 mg daily  Dexamethasone 4 mg daily with a plan to taper it off in a month Hold off on platelet transfusions She will need weekly lab checks.  [facility] I will discuss the lab results with her through a telephone visit in 1 week  No orders of the defined types were placed in this encounter.  The patient has a good understanding of the overall plan. she agrees with it. she will call with any problems that may develop before the next visit here.  Nicholas Lose, MD 07/16/2019  Julious Oka Dorshimer am acting as scribe for Dr. Nicholas Lose.  I have reviewed the above documentation for accuracy and completeness, and I agree with the above.

## 2019-07-16 ENCOUNTER — Other Ambulatory Visit: Payer: Self-pay

## 2019-07-16 ENCOUNTER — Inpatient Hospital Stay: Payer: BC Managed Care – PPO | Attending: Hematology and Oncology | Admitting: Hematology and Oncology

## 2019-07-16 ENCOUNTER — Telehealth: Payer: Self-pay | Admitting: Hematology and Oncology

## 2019-07-16 DIAGNOSIS — K068 Other specified disorders of gingiva and edentulous alveolar ridge: Secondary | ICD-10-CM | POA: Diagnosis not present

## 2019-07-16 DIAGNOSIS — D693 Immune thrombocytopenic purpura: Secondary | ICD-10-CM | POA: Insufficient documentation

## 2019-07-16 DIAGNOSIS — M329 Systemic lupus erythematosus, unspecified: Secondary | ICD-10-CM | POA: Insufficient documentation

## 2019-07-16 MED ORDER — ELTROMBOPAG OLAMINE 50 MG PO TABS: 25.0000 mg | ORAL_TABLET | Freq: Every day | ORAL | 6 refills | Status: DC

## 2019-07-16 MED ORDER — HYDROXYCHLOROQUINE SULFATE 200 MG PO TABS: 200.0000 mg | ORAL_TABLET | Freq: Every day | ORAL | Status: DC

## 2019-07-16 MED ORDER — DEXAMETHASONE 1 MG PO TABS: 4.0000 mg | ORAL_TABLET | Freq: Two times a day (BID) | ORAL | 0 refills | Status: DC

## 2019-07-16 NOTE — Assessment & Plan Note (Signed)
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 She had a bone marrow biopsy there and was treated with IVIG prednisone and rituximab. 2.Frequent relapses every 6 months. She required IVIG prednisone and Rituxan. 3.Moved to [place] and relapsed 07/06/2015, 02/27/2016 at [facility] 02/27/2016: 4 days of Decadron and IVIG and hydroxyurea for lupus(platelet count of 12)followed by Rituxan 07/06/2015: Hospitalization for ITP: Steroids with IVIG 4.Patient investigated and found dapsone 100 mg daily which has been preventing relapses of ITP. 5.10/24/2018: Platelets 49 when she ran out of dapsone. 6.Relapse 01/27/2019: Dexamethasone went into remission 7.05/17/2019: Relapse platelet count 17 ----------------------------------------------------------------------------------------------------------------------------------------------------------- Relapse: 05/17/2019: Platelet 17 Current treatment:Dexamethasone4 mg dailywith Promacta 50 mg daily held 06/20/2019 when platelet counts were 788  Lab review at [facility]: 06/20/2019: Platelet count 788 07/10/2019: Platelet count 404 07/15/2019: Platelet count  Plan: Restart Promacta 25 mg daily

## 2019-07-16 NOTE — Telephone Encounter (Signed)
PATIENTS already had appt per 11/3 los

## 2019-07-23 ENCOUNTER — Ambulatory Visit: Payer: BC Managed Care – PPO | Admitting: Hematology and Oncology

## 2019-08-01 ENCOUNTER — Inpatient Hospital Stay: Payer: BC Managed Care – PPO | Admitting: Hematology and Oncology

## 2019-08-01 DIAGNOSIS — D693 Immune thrombocytopenic purpura: Secondary | ICD-10-CM

## 2019-08-01 NOTE — Assessment & Plan Note (Signed)
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 She had a bone marrow biopsy there and was treated with IVIG prednisone and rituximab. 2.Frequent relapses every 6 months. She required IVIG prednisone and Rituxan. 3.Moved to [place] and relapsed 07/06/2015, 02/27/2016 at [facility] 02/27/2016: 4 days of Decadron and IVIG and hydroxyurea for lupus(platelet count of 12)followed by Rituxan 07/06/2015: Hospitalization for ITP: Steroids with IVIG 4.Patient investigated and found dapsone 100 mg daily which has been preventing relapses of ITP. 5.10/24/2018: Platelets 49 when she ran out of dapsone. 6.Relapse 01/27/2019: Dexamethasone went into remission 7.05/17/2019: Relapse platelet count 17 ----------------------------------------------------------------------------------------------------------------------------------------------------------- Relapse: 05/17/2019: Platelet 17 Current treatment:Dexamethasone4 mg dailywith Promacta 50 mg daily held 06/20/2019 when platelet counts were 788  Lab review at [facility]: 06/20/2019: Platelet count 788 07/10/2019: Platelet count404 07/15/2019: Platelet count 3  Plan: Restarted Promacta 25 mg daily 07/16/19 19 of Dexamethasone 4 mg daily with a plan to taper it off in a month

## 2019-08-07 ENCOUNTER — Telehealth: Payer: Self-pay | Admitting: Hematology and Oncology

## 2019-08-07 NOTE — Telephone Encounter (Signed)
I discussed with the patient that the platelet count was 32. She does not have any petechiae anymore.  No bruising or bleeding. We will keep the dosage of Promacta the same. She will be getting a blood count check next week and we will touch base with her at that time.

## 2019-08-20 ENCOUNTER — Telehealth: Payer: Self-pay | Admitting: Hematology and Oncology

## 2019-08-20 NOTE — Telephone Encounter (Signed)
08/20/2019: Platelet count 491 Instruction: Promacta 25 mg every other day Recheck CBC in 2 weeks We will call her once we see the results of her labs.

## 2019-09-23 ENCOUNTER — Other Ambulatory Visit: Payer: Self-pay | Admitting: Hematology and Oncology

## 2019-09-23 DIAGNOSIS — M329 Systemic lupus erythematosus, unspecified: Secondary | ICD-10-CM

## 2019-09-23 MED ORDER — DEXAMETHASONE 4 MG PO TABS: 4.0000 mg | ORAL_TABLET | Freq: Every day | ORAL | 1 refills | Status: DC

## 2019-09-23 NOTE — Progress Notes (Signed)
I was informed by patient's primary care physician at [facility] that her platelet count is 2 with petechiae and light nosebleeds. I instructed her to increase the Promacta to 50 mg daily and start dexamethasone 4 mg daily and recheck a platelet count in 1 week.

## 2019-10-10 ENCOUNTER — Other Ambulatory Visit: Payer: Self-pay | Admitting: *Deleted

## 2019-10-10 ENCOUNTER — Telehealth: Payer: Self-pay | Admitting: *Deleted

## 2019-10-10 MED ORDER — HYDROXYCHLOROQUINE SULFATE 200 MG PO TABS: 200.0000 mg | ORAL_TABLET | Freq: Every day | ORAL | Status: DC

## 2019-10-10 NOTE — Telephone Encounter (Signed)
Pt called stating she is confused on how much Promacta and prednisone she should be taking.  Pt states she stopped taking the Promacta on 09/23/2019 and has only been taking prednisone 4mg  tablet daily.  Per MD pt will have lab work drawn again tomorrow at [facility] and will review the lab work for further management.

## 2019-10-22 ENCOUNTER — Other Ambulatory Visit: Payer: Self-pay | Admitting: Pharmacist

## 2019-10-22 MED FILL — PROMACTA 50 MG TABLET: 50 | 30 days supply | Qty: 30 | Fill #2

## 2019-10-22 NOTE — Progress Notes (Signed)
Oral Chemotherapy Pharmacist Encounter   Confirmed with Dr. Pamelia Hoit, patient is currently taking 50mg  daily of Promacta. Update the patient medication list to reflect this dosing.  , PharmD, BCPS, [facility] Hematology/Oncology Clinical Pharmacist [facility] Oral Chemotherapy Navigation [facility] [phone]  10/22/2019 11:19 AM

## 2019-11-18 ENCOUNTER — Inpatient Hospital Stay: Payer: BC Managed Care – PPO | Admitting: Hematology and Oncology

## 2019-11-18 MED FILL — PROMACTA 50 MG TABLET: 50 | 30 days supply | Qty: 30 | Fill #3

## 2019-12-18 MED FILL — PROMACTA 50 MG TABLET: 50 | 30 days supply | Qty: 30 | Fill #4

## 2019-12-27 ENCOUNTER — Other Ambulatory Visit: Payer: Self-pay | Admitting: *Deleted

## 2019-12-27 MED ORDER — HYDROXYCHLOROQUINE SULFATE 200 MG PO TABS: 200.0000 mg | ORAL_TABLET | Freq: Every day | ORAL | 0 refills | Status: DC

## 2020-02-03 MED FILL — PROMACTA 50 MG TABLET: 50 | 30 days supply | Qty: 30 | Fill #5

## 2020-02-06 DIAGNOSIS — Z01419 Encounter for gynecological examination (general) (routine) without abnormal findings: Secondary | ICD-10-CM | POA: Diagnosis not present

## 2020-02-07 DIAGNOSIS — Z23 Encounter for immunization: Secondary | ICD-10-CM | POA: Diagnosis not present

## 2020-02-11 DIAGNOSIS — Z01419 Encounter for gynecological examination (general) (routine) without abnormal findings: Secondary | ICD-10-CM | POA: Diagnosis not present

## 2020-02-26 ENCOUNTER — Other Ambulatory Visit: Payer: Self-pay | Admitting: Hematology and Oncology

## 2020-03-26 MED FILL — PROMACTA 50 MG TABLET: 50 | 30 days supply | Qty: 30 | Fill #6

## 2020-04-14 ENCOUNTER — Other Ambulatory Visit: Payer: Self-pay | Admitting: Hematology and Oncology

## 2020-04-16 ENCOUNTER — Other Ambulatory Visit: Payer: Self-pay | Admitting: Hematology and Oncology

## 2020-04-21 MED FILL — PROMACTA 50 MG TABLET: 50 | 30 days supply | Qty: 30 | Fill #0

## 2020-04-24 ENCOUNTER — Encounter (HOSPITAL_COMMUNITY): Payer: Self-pay

## 2020-04-24 ENCOUNTER — Other Ambulatory Visit: Payer: Self-pay

## 2020-04-24 ENCOUNTER — Inpatient Hospital Stay (HOSPITAL_COMMUNITY): Payer: BC Managed Care – PPO

## 2020-04-24 ENCOUNTER — Telehealth: Payer: Self-pay | Admitting: *Deleted

## 2020-04-24 ENCOUNTER — Emergency Department (HOSPITAL_COMMUNITY): Payer: BC Managed Care – PPO

## 2020-04-24 ENCOUNTER — Inpatient Hospital Stay (HOSPITAL_COMMUNITY)
Admission: EM | Admit: 2020-04-24 | Discharge: 2020-04-28 | DRG: 064 | Disposition: A | Discharge status: Home / Self Care | Payer: BC Managed Care – PPO | Attending: Internal Medicine | Admitting: Internal Medicine

## 2020-04-24 DIAGNOSIS — S06360A Traumatic hemorrhage of cerebrum, unspecified, without loss of consciousness, initial encounter: Secondary | ICD-10-CM

## 2020-04-24 DIAGNOSIS — S06890A Other specified intracranial injury without loss of consciousness, initial encounter: Secondary | ICD-10-CM

## 2020-04-24 DIAGNOSIS — I6389 Other cerebral infarction: Secondary | ICD-10-CM

## 2020-04-24 DIAGNOSIS — D509 Iron deficiency anemia, unspecified: Secondary | ICD-10-CM | POA: Diagnosis present

## 2020-04-24 DIAGNOSIS — D693 Immune thrombocytopenic purpura: Secondary | ICD-10-CM | POA: Diagnosis not present

## 2020-04-24 DIAGNOSIS — M329 Systemic lupus erythematosus, unspecified: Secondary | ICD-10-CM | POA: Diagnosis present

## 2020-04-24 DIAGNOSIS — I10 Essential (primary) hypertension: Secondary | ICD-10-CM | POA: Diagnosis not present

## 2020-04-24 DIAGNOSIS — G939 Disorder of brain, unspecified: Secondary | ICD-10-CM | POA: Diagnosis not present

## 2020-04-24 DIAGNOSIS — I611 Nontraumatic intracerebral hemorrhage in hemisphere, cortical: Principal | ICD-10-CM | POA: Diagnosis present

## 2020-04-24 DIAGNOSIS — Z882 Allergy status to sulfonamides status: Secondary | ICD-10-CM | POA: Diagnosis not present

## 2020-04-24 DIAGNOSIS — I4510 Unspecified right bundle-branch block: Secondary | ICD-10-CM | POA: Diagnosis not present

## 2020-04-24 DIAGNOSIS — G8192 Hemiplegia, unspecified affecting left dominant side: Secondary | ICD-10-CM | POA: Diagnosis not present

## 2020-04-24 DIAGNOSIS — G8194 Hemiplegia, unspecified affecting left nondominant side: Secondary | ICD-10-CM | POA: Diagnosis not present

## 2020-04-24 DIAGNOSIS — E611 Iron deficiency: Secondary | ICD-10-CM

## 2020-04-24 DIAGNOSIS — Z8249 Family history of ischemic heart disease and other diseases of the circulatory system: Secondary | ICD-10-CM

## 2020-04-24 DIAGNOSIS — E785 Hyperlipidemia, unspecified: Secondary | ICD-10-CM | POA: Diagnosis present

## 2020-04-24 DIAGNOSIS — G936 Cerebral edema: Secondary | ICD-10-CM | POA: Diagnosis not present

## 2020-04-24 DIAGNOSIS — S0633AA Contusion and laceration of cerebrum, unspecified, with loss of consciousness status unknown, initial encounter: Secondary | ICD-10-CM

## 2020-04-24 DIAGNOSIS — R7302 Impaired glucose tolerance (oral): Secondary | ICD-10-CM | POA: Diagnosis not present

## 2020-04-24 DIAGNOSIS — I629 Nontraumatic intracranial hemorrhage, unspecified: Secondary | ICD-10-CM | POA: Diagnosis not present

## 2020-04-24 DIAGNOSIS — R000 Tachycardia, unspecified: Secondary | ICD-10-CM | POA: Diagnosis not present

## 2020-04-24 DIAGNOSIS — G9389 Other specified disorders of brain: Secondary | ICD-10-CM | POA: Diagnosis not present

## 2020-04-24 DIAGNOSIS — Z83438 Family history of other disorder of lipoprotein metabolism and other lipidemia: Secondary | ICD-10-CM | POA: Diagnosis not present

## 2020-04-24 DIAGNOSIS — Z791 Long term (current) use of non-steroidal anti-inflammatories (NSAID): Secondary | ICD-10-CM | POA: Diagnosis not present

## 2020-04-24 DIAGNOSIS — I619 Nontraumatic intracerebral hemorrhage, unspecified: Secondary | ICD-10-CM | POA: Diagnosis not present

## 2020-04-24 DIAGNOSIS — Z833 Family history of diabetes mellitus: Secondary | ICD-10-CM

## 2020-04-24 DIAGNOSIS — Z20822 Contact with and (suspected) exposure to covid-19: Secondary | ICD-10-CM | POA: Diagnosis present

## 2020-04-24 HISTORY — DX: Systemic lupus erythematosus, unspecified: M32.9

## 2020-04-24 HISTORY — DX: Personal history of diseases of the blood and blood-forming organs and certain disorders involving the immune mechanism: Z86.2

## 2020-04-24 LAB — COMPREHENSIVE METABOLIC PANEL
ALT: 14 U/L (ref 0–44)
AST: 20 U/L (ref 15–41)
Albumin: 4.4 g/dL (ref 3.5–5.0)
Alkaline Phosphatase: 58 U/L (ref 38–126)
Anion gap: 9 (ref 5–15)
BUN: 10 mg/dL (ref 6–20)
CO2: 24 mmol/L (ref 22–32)
Calcium: 9.2 mg/dL (ref 8.9–10.3)
Chloride: 103 mmol/L (ref 98–111)
Creatinine, Ser: 0.72 mg/dL (ref 0.44–1.00)
GFR calc Af Amer: >60 mL/min (ref >60)
GFR calc non Af Amer: >60 mL/min (ref >60)
Glucose, Bld: 120 mg/dL — ABNORMAL HIGH (ref 70–99)
Potassium: 3.5 mmol/L (ref 3.5–5.1)
Sodium: 136 mmol/L (ref 135–145)
Total Bilirubin: <0.1 mg/dL — ABNORMAL LOW (ref 0.3–1.2)
Total Protein: 9.7 g/dL — ABNORMAL HIGH (ref 6.5–8.1)

## 2020-04-24 LAB — RAPID URINE DRUG SCREEN, HOSP PERFORMED
Amphetamines: NOT DETECTED
Barbiturates: NOT DETECTED
Benzodiazepines: NOT DETECTED
Cocaine: NOT DETECTED
Opiates: NOT DETECTED
Tetrahydrocannabinol: NOT DETECTED

## 2020-04-24 LAB — CBC WITH DIFFERENTIAL/PLATELET
Abs Immature Granulocytes: 0.03 10*3/uL (ref 0.00–0.07)
Basophils Absolute: 0 10*3/uL (ref 0.0–0.1)
Basophils Relative: 0 %
Eosinophils Absolute: 0.1 10*3/uL (ref 0.0–0.5)
Eosinophils Relative: 1 %
HCT: 33.7 % — ABNORMAL LOW (ref 36.0–46.0)
Hemoglobin: 9.3 g/dL — ABNORMAL LOW (ref 12.0–15.0)
Immature Granulocytes: 0 %
Lymphocytes Relative: 22 %
Lymphs Abs: 1.9 10*3/uL (ref 0.7–4.0)
MCH: 18 pg — ABNORMAL LOW (ref 26.0–34.0)
MCHC: 27.6 g/dL — ABNORMAL LOW (ref 30.0–36.0)
MCV: 65.1 fL — ABNORMAL LOW (ref 80.0–100.0)
Monocytes Absolute: 0.4 10*3/uL (ref 0.1–1.0)
Monocytes Relative: 5 %
Neutro Abs: 6.1 10*3/uL (ref 1.7–7.7)
Neutrophils Relative %: 72 %
Platelets: 461 10*3/uL — ABNORMAL HIGH (ref 150–400)
RBC: 5.18 MIL/uL — ABNORMAL HIGH (ref 3.87–5.11)
RDW: 18.4 % — ABNORMAL HIGH (ref 11.5–15.5)
WBC: 8.5 10*3/uL (ref 4.0–10.5)
nRBC: 0 % (ref 0.0–0.2)

## 2020-04-24 LAB — SEDIMENTATION RATE: Sed Rate: 62 mm/hr — ABNORMAL HIGH (ref 0–22)

## 2020-04-24 LAB — MRSA PCR SCREENING: MRSA by PCR: NEGATIVE

## 2020-04-24 LAB — URINALYSIS, ROUTINE W REFLEX MICROSCOPIC
Bilirubin Urine: NEGATIVE
Glucose, UA: NEGATIVE mg/dL
Ketones, ur: 5 mg/dL — AB
Leukocytes,Ua: NEGATIVE
Nitrite: NEGATIVE
Protein, ur: NEGATIVE mg/dL
Specific Gravity, Urine: 1.027 (ref 1.005–1.030)
pH: 5 (ref 5.0–8.0)

## 2020-04-24 LAB — I-STAT BETA HCG BLOOD, ED (MC, WL, AP ONLY): I-stat hCG, quantitative: <5 m[IU]/mL (ref <5)

## 2020-04-24 LAB — SARS CORONAVIRUS 2 BY RT PCR (HOSPITAL ORDER, PERFORMED IN ~~LOC~~ HOSPITAL LAB): SARS Coronavirus 2: NEGATIVE

## 2020-04-24 LAB — ETHANOL: Alcohol, Ethyl (B): <10 mg/dL (ref <10)

## 2020-04-24 LAB — C-REACTIVE PROTEIN: CRP: 11.2 mg/dL — ABNORMAL HIGH (ref <1.0)

## 2020-04-24 LAB — CBG MONITORING, ED: Glucose-Capillary: 106 mg/dL — ABNORMAL HIGH (ref 70–99)

## 2020-04-24 LAB — PROTIME-INR
INR: 1.1 (ref 0.8–1.2)
Prothrombin Time: 13.6 seconds (ref 11.4–15.2)

## 2020-04-24 LAB — APTT: aPTT: 47 seconds — ABNORMAL HIGH (ref 24–36)

## 2020-04-24 MED ORDER — ACETAMINOPHEN 650 MG RE SUPP: 650.0000 mg | RECTAL | Status: DC | PRN

## 2020-04-24 MED ORDER — GADOBUTROL 1 MMOL/ML IV SOLN
4.0000 mL | Freq: Once | INTRAVENOUS | Status: AC | PRN
  Administered 2020-04-24: 4 mL via INTRAVENOUS

## 2020-04-24 MED ORDER — STROKE: EARLY STAGES OF RECOVERY BOOK
Freq: Once | Status: DC
  Filled 2020-04-24: qty 1

## 2020-04-24 MED ORDER — PANTOPRAZOLE SODIUM 40 MG IV SOLR
40.0000 mg | Freq: Every day | INTRAVENOUS | Status: DC
  Administered 2020-04-24: 40 mg via INTRAVENOUS
  Filled 2020-04-24: qty 40

## 2020-04-24 MED ORDER — ACETAMINOPHEN 325 MG PO TABS
650.0000 mg | ORAL_TABLET | ORAL | Status: DC | PRN
  Administered 2020-04-24 – 2020-04-27 (×11): 650 mg via ORAL
  Filled 2020-04-24 (×12): qty 2

## 2020-04-24 MED ORDER — SODIUM CHLORIDE 0.9 % IV SOLN: INTRAVENOUS | Status: DC

## 2020-04-24 MED ORDER — MORPHINE SULFATE (PF) 4 MG/ML IV SOLN
4.0000 mg | Freq: Once | INTRAVENOUS | Status: AC
  Administered 2020-04-24: 4 mg via INTRAVENOUS
  Filled 2020-04-24: qty 1

## 2020-04-24 MED ORDER — ACETAMINOPHEN 160 MG/5ML PO SOLN: 650.0000 mg | ORAL | Status: DC | PRN

## 2020-04-24 MED ORDER — ELTROMBOPAG OLAMINE 50 MG PO TABS
50.0000 mg | ORAL_TABLET | Freq: Every day | ORAL | Status: DC
  Administered 2020-04-25 – 2020-04-26 (×2): 50 mg via ORAL
  Filled 2020-04-24 (×2): qty 1

## 2020-04-24 MED ORDER — SENNOSIDES-DOCUSATE SODIUM 8.6-50 MG PO TABS
1.0000 | ORAL_TABLET | Freq: Two times a day (BID) | ORAL | Status: DC
  Administered 2020-04-24 – 2020-04-27 (×3): 1 via ORAL
  Filled 2020-04-24 (×5): qty 1

## 2020-04-24 MED ORDER — NORETHINDRONE 0.35 MG PO TABS
1.0000 | ORAL_TABLET | Freq: Every day | ORAL | Status: DC
  Administered 2020-04-25 – 2020-04-27 (×3): 0.35 mg via ORAL
  Filled 2020-04-24: qty 1

## 2020-04-24 MED ORDER — HYDROXYCHLOROQUINE SULFATE 200 MG PO TABS
200.0000 mg | ORAL_TABLET | Freq: Every day | ORAL | Status: DC
  Administered 2020-04-24 – 2020-04-27 (×4): 200 mg via ORAL
  Filled 2020-04-24 (×5): qty 1

## 2020-04-24 MED ORDER — CLEVIDIPINE BUTYRATE 0.5 MG/ML IV EMUL: 0.0000 mg/h | INTRAVENOUS | Status: DC

## 2020-04-24 MED ORDER — CHLORHEXIDINE GLUCONATE CLOTH 2 % EX PADS
6.0000 | MEDICATED_PAD | Freq: Every day | CUTANEOUS | Status: DC
  Administered 2020-04-25 – 2020-04-26 (×2): 6 via TOPICAL

## 2020-04-24 NOTE — ED Provider Notes (Signed)
[facility]-EMERGENCY DEPT Provider Note   CSN: [id number] Arrival date & time: 04/24/20  9562     History Chief Complaint  Patient presents with  . Numbness    Rebecca Bright is a 24 y.o. female.  The history is provided by the patient and medical records. No language interpreter was used.     24 year old female with hx of ITP and Lupus on Promacta and Plaquenil presenting for evaluation of left-sided numbness and weakness.  Patient report last night around 6 PM she developed gradual onset of numbness and weakness to her left upper and lower extremities.  She described the sensation as a heaviness sensation to her arm and leg which caused her some difficulty ambulating.  She also endorsed feeling a bit confused when she was walking to the wrong apartment.  She have noticed that she is bumping and stopping to things due to her numbness.  She described as if her leg is falling asleep.  She also endorsed a mild throbbing headache as well as jaw pain for the past week which she attributes to her wisdom teeth grinding.  Pain does radiates to the back of her neck but denies any significant pain to the side of her neck.  No associated chest pain or shortness of breath no productive cough no nausea vomiting diarrhea abdominal pain bowel bladder incontinence or saddle anesthesia.  Last menstrual period was a week ago.  She denies alcohol or tobacco abuse.    Past Medical History:  Diagnosis Date  . History of ITP   . Lupus [facility])     Patient Active Problem List   Diagnosis Date Noted  . Acute ITP (HCC) 11/15/2018    Past Surgical History:  Procedure Laterality Date  . HERNIA REPAIR       OB History   No obstetric history on file.     Family History  Problem Relation Age of Onset  . Hypertension Mother   . Diabetes Father   . High Cholesterol Father     Social History   Tobacco Use  . Smoking status: Never Smoker  . Smokeless tobacco: Never Used    Vaping Use  . Vaping Use: Never used  Substance Use Topics  . Alcohol use: Never  . Drug use: Never    Home Medications Prior to Admission medications   Medication Sig Start Date End Date Taking? Authorizing Provider  acetaminophen (TYLENOL) 325 MG tablet Take 325-650 mg by mouth every 6 (six) hours as needed for mild pain or headache.   Yes [provider]  CAMILA 0.35 MG tablet Take 1 tablet by mouth daily. 02/06/20  Yes [provider]  hydroxychloroquine (PLAQUENIL) 200 MG tablet TAKE 1 TABLET BY MOUTH EVERY DAY Patient taking differently: Take 200 mg by mouth daily.  02/27/20  Yes Serena Croissant, MD  PROMACTA 50 MG tablet TAKE 1 TABLET (50 MG TOTAL) BY MOUTH DAILY. TAKE ON AN EMPTY STOMACH 1 HOUR BEFORE A MEAL OR 2 HOURS AFTER Patient taking differently: TAKE 1 TABLET (50 MG TOTAL) BY MOUTH DAILY. TAKE ON AN EMPTY STOMACH 1 HOUR BEFORE A MEAL OR 2 HOURS AFTER 04/16/20  Yes Serena Croissant, MD    Allergies    Sulfa antibiotics and Sulfur  Review of Systems   Review of Systems  All other systems reviewed and are negative.   Physical Exam Updated Vital Signs BP (!) 125/93 (BP Location: Left Arm)   Pulse 99   Temp 99.3 F (  37.4 C) (Oral)   Resp 18   Ht 4\' 10"  (1.473 m)   Wt 38.1 kg   LMP 04/16/2020   SpO2 (!) 15%   BMI 17.56 kg/m   Physical Exam Vitals and nursing note reviewed.  Constitutional:      General: She is not in acute distress.    Appearance: She is well-developed.  HENT:     Head: Atraumatic.  Eyes:     Extraocular Movements: Extraocular movements intact.     Conjunctiva/sclera: Conjunctivae normal.     Pupils: Pupils are equal, round, and reactive to light.  Neck:     Vascular: No carotid bruit.  Cardiovascular:     Rate and Rhythm: Normal rate and regular rhythm.     Pulses: Normal pulses.     Heart sounds: Normal heart sounds.  Pulmonary:     Effort: Pulmonary effort is normal.     Breath sounds: Normal breath sounds.   Abdominal:     Palpations: Abdomen is soft.     Tenderness: There is no abdominal tenderness.  Musculoskeletal:     Cervical back: Normal range of motion and neck supple. No tenderness.  Skin:    Findings: No rash.  Neurological:     Mental Status: She is alert.     Comments: Neurologic exam:  Speech clear, pupils equal round reactive to light, extraocular movements intact  Normal peripheral visual fields Cranial nerves III through XII normal including no facial droop Follows commands, 5/5 RUE, 4/5 LUE, 5/5 RLE, 4/5 LLE Normal sensation BUE, diminished sensation LLE to medial aspect of thigh and leg.  Loss of proprioception of L toes. Coordination intact, no limb ataxia, finger-nose-finger normal Rapid alternating movements normal No pronator drift Gait walks clumsily      ED Results / Procedures / Treatments   Labs (all labs ordered are listed, but only abnormal results are displayed) Labs Reviewed  COMPREHENSIVE METABOLIC PANEL - Abnormal; Notable for the following components:      Result Value   Glucose, Bld 120 (*)    Total Protein 9.7 (*)    Total Bilirubin <0.1 (*)    All other components within normal limits  CBC WITH DIFFERENTIAL/PLATELET - Abnormal; Notable for the following components:   RBC 5.18 (*)    Hemoglobin 9.3 (*)    HCT 33.7 (*)    MCV 65.1 (*)    MCH 18.0 (*)    MCHC 27.6 (*)    RDW 18.4 (*)    Platelets 461 (*)    All other components within normal limits  SEDIMENTATION RATE - Abnormal; Notable for the following components:   Sed Rate 62 (*)    All other components within normal limits  C-REACTIVE PROTEIN - Abnormal; Notable for the following components:   CRP 11.2 (*)    All other components within normal limits  APTT - Abnormal; Notable for the following components:   aPTT 47 (*)    All other components within normal limits  URINALYSIS, ROUTINE W REFLEX MICROSCOPIC - Abnormal; Notable for the following components:   Hgb urine dipstick  SMALL (*)    Ketones, ur 5 (*)    Bacteria, UA RARE (*)    All other components within normal limits  CBG MONITORING, ED - Abnormal; Notable for the following components:   Glucose-Capillary 106 (*)    All other components within normal limits  SARS CORONAVIRUS 2 BY RT PCR (HOSPITAL ORDER, PERFORMED IN  HOSPITAL LAB)  ETHANOL  RAPID URINE DRUG SCREEN, HOSP PERFORMED  PROTIME-INR  I-STAT BETA HCG BLOOD, ED ([facility], [facility], [facility] ONLY)    EKG None  Radiology CT Head Wo Contrast  Result Date: 04/24/2020 CLINICAL DATA:  Numbness and heaviness of the LEFT leg EXAM: CT HEAD WITHOUT CONTRAST TECHNIQUE: Contiguous axial images were obtained from the base of the skull through the vertex without intravenous contrast. COMPARISON:  None. FINDINGS: Brain: There is masslike hypodensity within the RIGHT frontoparietal vertex. There is central punctate hyperdensity within this white matter hypodensity. This spans approximately 4 cm. Vascular: No hyperdense vessel or unexpected calcification. Skull: Normal. Negative for fracture or focal lesion. Sinuses/Orbits: No acute finding. Other: None. IMPRESSION: There is masslike hypodensity within the RIGHT frontoparietal vertex. There is central punctate hyperdensity within this white matter hypodensity. This spans approximately 4 cm. Findings are concerning for a mass with associated hemorrhage. Infarction is felt less likely but venous infarction is on the differential. Recommend further evaluation with contrast-enhanced MRI of the brain. These results were called by telephone at the time of interpretation on 04/24/2020 at 12:16 pm to provider [facility] Ma[facility] Bull[facility] , who verbally acknowledged these results. Electronically Signed   By: Meda KlinefelterStephanie  Peacock MD   On: 04/24/2020 12:20   MR ANGIO HEAD WO CONTRAST  Result Date: 04/24/2020 CLINICAL DATA:  Left-sided weakness and numbness with abnormal CT, per EMR history of ITP secondary to lupus EXAM: MRI HEAD WITHOUT AND WITH  CONTRAST MRA HEAD WITHOUT CONTRAST MRA NECK WITHOUT AND WITH CONTRAST TECHNIQUE: Multiplanar, multiecho pulse sequences of the brain and surrounding structures were obtained without and with intravenous contrast. Angiographic images of the Circle of Willis were obtained using MRA technique without intravenous contrast. Angiographic images of the neck were obtained using MRA technique without and with intravenous contrast. Carotid stenosis measurements (when applicable) are obtained utilizing NASCET criteria, using the distal internal carotid diameter as the denominator. CONTRAST:  4mL GADAVIST GADOBUTROL 1 MMOL/ML IV SOLN COMPARISON:  Correlation made with same day CT FINDINGS: MRI HEAD Brain: Heterogeneous signal is identified along the parasagittal right parietal lobe with moderate surrounding edema. There is marked corresponding susceptibility reflecting blood products. Regional mass effect is present. There is no evidence of intraventricular extension. There is no abnormal enhancement. A punctate focus of susceptibility along the right caudate likely reflects a chronic microhemorrhage. Vascular: Major vessel flow voids at the skull base are preserved. Skull and upper cervical spine: Normal marrow signal is preserved. Sinuses/Orbits: Paranasal sinuses are aerated. Orbits are unremarkable. Other: Sella is unremarkable.  Mastoid air cells are clear. MRA HEAD Intracranial internal carotid arteries are patent. Middle and anterior cerebral arteries are patent. Intracranial vertebral arteries, basilar artery, posterior cerebral arteries are patent. There is no significant stenosis or aneurysm. MRA NECK Great vessel origins are patent. Common, internal, and external carotid arteries are patent. Extracranial vertebral arteries are patent. There is no measurable stenosis. IMPRESSION: Large area of heterogeneous signal along the parasagittal right parietal lobe with surrounding edema and mild regional mass effect.  Appearance is most consistent with a parenchymal hematoma without evidence of underlying lesion. Unremarkable vascular imaging. Electronically Signed   By: Guadlupe SpanishPraneil  Patel M.D.   On: 04/24/2020 15:25   MR Angiogram Neck W or Wo Contrast  Result Date: 04/24/2020 CLINICAL DATA:  Left-sided weakness and numbness with abnormal CT, per EMR history of ITP secondary to lupus EXAM: MRI HEAD WITHOUT AND WITH CONTRAST MRA HEAD WITHOUT CONTRAST MRA NECK WITHOUT AND WITH CONTRAST TECHNIQUE: Multiplanar, multiecho pulse sequences of  the brain and surrounding structures were obtained without and with intravenous contrast. Angiographic images of the Circle of Willis were obtained using MRA technique without intravenous contrast. Angiographic images of the neck were obtained using MRA technique without and with intravenous contrast. Carotid stenosis measurements (when applicable) are obtained utilizing NASCET criteria, using the distal internal carotid diameter as the denominator. CONTRAST:  43mL GADAVIST GADOBUTROL 1 MMOL/ML IV SOLN COMPARISON:  Correlation made with same day CT FINDINGS: MRI HEAD Brain: Heterogeneous signal is identified along the parasagittal right parietal lobe with moderate surrounding edema. There is marked corresponding susceptibility reflecting blood products. Regional mass effect is present. There is no evidence of intraventricular extension. There is no abnormal enhancement. A punctate focus of susceptibility along the right caudate likely reflects a chronic microhemorrhage. Vascular: Major vessel flow voids at the skull base are preserved. Skull and upper cervical spine: Normal marrow signal is preserved. Sinuses/Orbits: Paranasal sinuses are aerated. Orbits are unremarkable. Other: Sella is unremarkable.  Mastoid air cells are clear. MRA HEAD Intracranial internal carotid arteries are patent. Middle and anterior cerebral arteries are patent. Intracranial vertebral arteries, basilar artery, posterior  cerebral arteries are patent. There is no significant stenosis or aneurysm. MRA NECK Great vessel origins are patent. Common, internal, and external carotid arteries are patent. Extracranial vertebral arteries are patent. There is no measurable stenosis. IMPRESSION: Large area of heterogeneous signal along the parasagittal right parietal lobe with surrounding edema and mild regional mass effect. Appearance is most consistent with a parenchymal hematoma without evidence of underlying lesion. Unremarkable vascular imaging. Electronically Signed   By: Guadlupe Spanish M.D.   On: 04/24/2020 15:25   MR Brain W and Wo Contrast  Result Date: 04/24/2020 CLINICAL DATA:  Left-sided weakness and numbness with abnormal CT, per EMR history of ITP secondary to lupus EXAM: MRI HEAD WITHOUT AND WITH CONTRAST MRA HEAD WITHOUT CONTRAST MRA NECK WITHOUT AND WITH CONTRAST TECHNIQUE: Multiplanar, multiecho pulse sequences of the brain and surrounding structures were obtained without and with intravenous contrast. Angiographic images of the Circle of Willis were obtained using MRA technique without intravenous contrast. Angiographic images of the neck were obtained using MRA technique without and with intravenous contrast. Carotid stenosis measurements (when applicable) are obtained utilizing NASCET criteria, using the distal internal carotid diameter as the denominator. CONTRAST:  32mL GADAVIST GADOBUTROL 1 MMOL/ML IV SOLN COMPARISON:  Correlation made with same day CT FINDINGS: MRI HEAD Brain: Heterogeneous signal is identified along the parasagittal right parietal lobe with moderate surrounding edema. There is marked corresponding susceptibility reflecting blood products. Regional mass effect is present. There is no evidence of intraventricular extension. There is no abnormal enhancement. A punctate focus of susceptibility along the right caudate likely reflects a chronic microhemorrhage. Vascular: Major vessel flow voids at the  skull base are preserved. Skull and upper cervical spine: Normal marrow signal is preserved. Sinuses/Orbits: Paranasal sinuses are aerated. Orbits are unremarkable. Other: Sella is unremarkable.  Mastoid air cells are clear. MRA HEAD Intracranial internal carotid arteries are patent. Middle and anterior cerebral arteries are patent. Intracranial vertebral arteries, basilar artery, posterior cerebral arteries are patent. There is no significant stenosis or aneurysm. MRA NECK Great vessel origins are patent. Common, internal, and external carotid arteries are patent. Extracranial vertebral arteries are patent. There is no measurable stenosis. IMPRESSION: Large area of heterogeneous signal along the parasagittal right parietal lobe with surrounding edema and mild regional mass effect. Appearance is most consistent with a parenchymal hematoma without evidence of underlying lesion.  Unremarkable vascular imaging. Electronically Signed   By: Guadlupe Spanish M.D.   On: 04/24/2020 15:25    Procedures .Critical Care Performed by: Fayrene Helper, PA-C Authorized by: Fayrene Helper, PA-C   Critical care provider statement:    Critical care time (minutes):  40   Critical care was time spent personally by me on the following activities:  Discussions with consultants, evaluation of patient's response to treatment, examination of patient, ordering and performing treatments and interventions, ordering and review of laboratory studies, ordering and review of radiographic studies, pulse oximetry, re-evaluation of patient's condition, obtaining history from patient or surrogate and review of old charts   (including critical care time)  Medications Ordered in ED Medications  morphine 4 MG/ML injection 4 mg (4 mg Intravenous Given 04/24/20 1630)  gadobutrol (GADAVIST) 1 MMOL/ML injection 4 mL (4 mLs Intravenous Contrast Given 04/24/20 1411)    ED Course  I have reviewed the triage vital signs and the nursing  notes.  Pertinent labs & imaging results that were available during my care of the patient were reviewed by me and considered in my medical decision making (see chart for details).    MDM Rules/Calculators/A&P                          BP 129/76 (BP Location: Left Arm)   Pulse 96   Temp 99.3 F (37.4 C) (Oral)   Resp 18   Ht 4\' 10"  (1.473 m)   Wt 38.1 kg   LMP 04/16/2020   SpO2 100%   BMI 17.56 kg/m   Final Clinical Impression(s) / ED Diagnoses Final diagnoses:  Intracranial hematoma without loss of consciousness, initial encounter (HCC)  Acute left hemiparesis (HCC)    Rx / DC Orders ED Discharge Orders    None     11:41 AM This is a 24 year old female significant history of ITP and lupus here with complaints of left upper and lower extremity weakness with decreased sensation to the left lower extremity.  Last known normal was 6 PM last night.  On exam patient does have decreased strength to left upper and lower extremity as well as diminished sensation to her left lower extremity.  No visual changes.  I did discuss this with Dr. 25 as well as on-call neurologist Dr. Vanessa Kick.    12:31 PM Head CT scan demonstrate a masslike hypodensity within the right frontoparietal vertex with a central punctate hyperdensity within this white matter hypodensity.  Findings concerning for mass with associated hemorrhage.  Infarction is less likely but venous infarction is on the differential.  Radiologist recommend further evaluation with contrast-enhanced MRI of the brain.  Furthermore, will obtain head and neck MRA.  Patient made aware of plan.  4:42 PM Brain MRI demonstrate a large area of heterogeneous signal along the parasagittal right parietal lobe with surrounding edema and mild regional mass-effect.  Appearance is most consistent with parenchymal hematoma without evidence of underlying lesion.  I did discuss this finding with neurologist, Dr. Iver Nestle, and I have also consulted with  neurosurgeon Dr. Iver Nestle.  Dr. Wynetta Emery has review the imaging and felt that this is not likely needing emergent surgical intervention.  He recommend neurology to work-up further.  Dr.Bhagat request for hospitalist for admission and transfer to [facility] for further management.  He does not recommend steroid medication.  At this time patient is mentating appropriately, endorsed mild headache which improved with pain medication.  She is protecting her airway and  is aware of plan and agrees with the plan.  Appreciate consultation from [facility] hospitalist, Dr. Mahala Menghini who agrees to see and admit patient to [facility].  He will reach out to neurologist for further recommendation.  Please be aware that patient does not have any trauma that precipitate this episode.    Sentoria Brent was evaluated in Emergency Department on 04/24/2020 for the symptoms described in the history of present illness. She was evaluated in the context of the global COVID-19 pandemic, which necessitated consideration that the patient might be at risk for infection with the SARS-CoV-2 virus that causes COVID-19. Institutional protocols and algorithms that pertain to the evaluation of patients at risk for COVID-19 are in a state of rapid change based on information released by regulatory bodies including the CDC and federal and state organizations. These policies and algorithms were followed during the patient's care in the ED.    Fayrene Helper, PA-C 04/24/20 1652    Arby Barrette, MD 04/30/20 [phone]

## 2020-04-24 NOTE — Consult Note (Signed)
Consult note  Rebecca Bright WUJ:[id number] DOB: 1995/11/02 DOA: 04/24/2020  PCP: Keith Rake, MD   Chief Complaint: HA as well as left-sided clumsiness  HPI:  24 year old [place] female known history of lupus and ITP secondary to this followed by Dr. Gavin Pound of rheumatology as well as by Dr. Lindi Adie for the ITP diagnosed 2016 (on Promacta per him) she has had  Bone marrow biopsy and was treated in the [employer] where father was stationed with IVIG prednisone Rituxan had relapses in 2016 hospitalized and given steroids and IVIG she had been on dapsone in the past and went into more durable remission early 2020  01/27/2019 but relapsed with platelets in the 17 range she has so far been on Decadron 4 daily [held because platelets were good and 788 range] continues on Promacta  She tells me she has been in remission for over 6 months since she has been on Promacta-has been on Plaquenil prior to being seen by Dr. 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 climbing a flight of stairs and felt lightheaded and was bumping into the door frame on the left side she also has had paresthesias in her lower extremities and was not making much sense to herself and was a little bit confused to her own self She fell off the bed last night and decided to go visit her dentist to see if this was all caused by her impacted wisdom teeth and was directed to the emergency room In the emergency room she had monitoring was given morphine which she reacted poorly to and a CT scan of the head showed a right frontoparietal masslike hypodensity about 4 cm large with associated hemorrhage-MRI subsequently performed showed 5 x 5 x-ray 0.1 X5.2 hemorrhage with T1 shortening reflecting subacute blood products-this was discussed with neurology and neurosurgery and she will be admitted to the  neurology service at [facility] for close intensive monitoring on the ICU Neurosurgeon Dr. Saintclair Halsted did review the films and did not recommend any acute surgery but probably follow-up MRI in several weeks and they are available for consult if needed per neurology services   Review of Systems:  No recent cough cold sinus pressure fall on the head   Pertinent +'s: Headache, bruxism, paresthesia left lower extremities and clumsiness  ED Course: Patient given morphine kept n.p.o. stroke score screen was passed underwent multiple scans   Past Medical History:  Diagnosis Date  . History of ITP   . Lupus [facility])    Past Surgical History:  Procedure Laterality Date  . HERNIA REPAIR      reports that she has never smoked. She has never used smokeless tobacco. She reports that she does not drink alcohol and does not use drugs.  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 Sulfa Antibiotics Other (See Comments)    Lupus increase   . Sulfur Other (See Comments)    Lupus Activity   Family History  Problem Relation Age of Onset  . Hypertension Mother   . Diabetes Father   . High Cholesterol Father    Prior to Admission medications   Medication Sig Start Date End Date Taking? Authorizing Provider  acetaminophen (TYLENOL) 325 MG tablet Take 325-650 mg by mouth every 6 (six) hours as needed for mild pain or  headache.   Yes [provider]  CAMILA 0.35 MG tablet Take 1 tablet by mouth daily. 02/06/20  Yes [provider]  hydroxychloroquine (PLAQUENIL) 200 MG tablet TAKE 1 TABLET BY MOUTH EVERY DAY Patient taking differently: Take 200 mg by mouth daily.  02/27/20  Yes Nicholas Lose, MD  PROMACTA 50 MG tablet TAKE 1 TABLET (50 MG TOTAL) BY MOUTH DAILY. TAKE ON AN EMPTY STOMACH 1 HOUR BEFORE A MEAL OR 2 HOURS AFTER Patient taking differently: TAKE 1 TABLET (50 MG TOTAL) BY MOUTH DAILY. TAKE ON AN  EMPTY STOMACH 1 HOUR BEFORE A MEAL OR 2 HOURS AFTER 04/16/20  Yes Nicholas Lose, MD    Physical Exam:  Vitals:   04/24/20 1230 04/24/20 1454  BP: 131/88 129/76  Pulse: 100 96  Resp: (!) 28 18  Temp:    SpO2: 100% 100%  Alert coherent  No distress EOMI NCAT no focal deficit  Vision by direct confrontation is normal  Uvula midline tongue protrudes midline no asymmetry to face wrinkles are symmetric and forehead  Sternocleidomastoid symmetric bilaterally  Chest clear no added sound no rales or rhonchi  S1-S2 no murmur rub or gallop  Abdomen soft nontender no rebound no guarding  Paresthesias in L1-L5 distribution in lower extremity  Hyperreflexic in both knees  Sensory is intact bilaterally in upper extremities on face and on shoulders  Finger-nose-finger grossly intact but she does feel a little bit more clumsy on the left side  Power is still grossly 5/5 and left upper and lower extremity but subjectively on my exam she is weaker when I asked her to finger grip and make Popeye muscles  I have personally reviewed following labs and imaging studies  Labs:   Glucose 120 total protein 9.7 bilirubin 0.1 albumin 4.4  ESR is 11 sed rate is 62  Imaging studies: As per above     Medical tests:   EKG independently reviewed: None performed  Test discussed with performing physician:  Yes I discussed the case with Dr. Lindi Adie of oncology  I also discussed the case with Dr. Curly Shores of neurology who will be assuming care at [place] to obtain old records:   Yes  Review and summation of old records:   Yes have reviewed  Active Problems:   * No active hospital problems. 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 Lindi Adie of oncology earlier today with regards to her intraparenchymal mass He  does not know of any side effects caused by some of the meds that she is on and she has been in remission for the past 6 months per her own admission He is equally puzzled as I am I asked her what could have caused this de novo area in her brain to occur Her CRP is elevated at 11 her sed rate is 62 raising the possibility of some autoimmune issue-I will attempt to call Dr. Gavin Pound but this may need to be followed up by neurologist later on today if I am unsuccessful At this juncture he does not recommend steroids secondary to no needs from the perspective of ITP but t will defer to neurology whether they feel this is necessary She is mentating well at this time is coherent and does not seem to have any focal deficits other than mild sensory losses I have also  spoken with Dr.bhagat of neurology who is excepting the patient to the ICU and I have tried to facilitate bed placement to get the bed available on that unit so that she can be seen semiemergently by her I have explained all of this to the patient's mother and the patient herself and they understand the plan of care  We will keep on supportive measures of IV fluid at 75 cc an hour We will allow a diet for now she is oriented and coherent She will need neurochecks q. 1 and ICU level care  Severity of Illness: The appropriate patient status for this patient is INPATIENT. Inpatient status is judged to be reasonable and necessary in order to provide the required intensity of service to ensure the patient's safety. The patient's presenting symptoms, physical exam findings, and initial radiographic and laboratory data in the context of their chronic comorbidities is felt to place them at high risk for further clinical deterioration. Furthermore, it is not anticipated that the patient will be medically stable for discharge from the hospital within 2 midnights of admission. The following factors support the patient status of inpatient.   " The  patient's presenting symptoms include headache. " The worrisome physical exam findings include weakness and clumsiness on left side. " The initial radiographic and laboratory data are worrisome because of potential for worsening. " The chronic co-morbidities include lupus.   * I certify that at the point of admission it is my clinical judgment that the patient will require inpatient hospital care spanning beyond 2 midnights from the point of admission due to high intensity of service, high risk for further deterioration and high frequency of surveillance required.*    DVT prophylaxis: SCD Code Status: Full Family Communication: Long discussion with mother Consults called: Oncologist and neurologist  Time spent: 54 minutes minutes  Verlon Au, MD Jerl Mina my NP partners at night for Care related issues] [facility] --[facility] [employer] [facility] , [email or web address]; password [facility]  04/24/2020, 4:38 PM

## 2020-04-24 NOTE — Progress Notes (Signed)
Patient ID: Rebecca Bright, female   DOB: Apr 15, 1996, 24 y.o.   MRN: [id number] Called and asked to review CT and MRI scan after radiology called intraparenchymal hematoma... I am not convinced that this is entirely hematoma I do agree there may be some microhemorrhages I think it is more than likely this may be either an ischemic event with her history of ITP and lupus or the could be something some sort of mass underlying.  Regardless there is no indication for surgery at this point recommend Medical Neurology evaluate and workup probably patient will need a repeat MRI scan 2-3 weeks may consider MRV.  We are available if Neurology feels there is anything additional we could offer them.

## 2020-04-24 NOTE — ED Provider Notes (Deleted)
Medical screening examination/treatment/procedure(s) were conducted as a shared visit with non-physician practitioner(s) and myself.  I personally evaluated the patient during the encounter.       Arby Barrette, MD 04/30/20 [phone]

## 2020-04-24 NOTE — Telephone Encounter (Signed)
Received call from pt requesting Mychart visit with MD to discuss possible oral surgery with hx of ITP.  Apt scheduled and pt verbalized understanding of apt date and time.

## 2020-04-24 NOTE — ED Triage Notes (Signed)
Patient c/o numbness and heaviness of the left leg and left arm since 1800 yesterday.

## 2020-04-24 NOTE — ED Notes (Signed)
Blood work sent to lab.

## 2020-04-24 NOTE — H&P (Signed)
Admission H&P    Chief Complaint: Left arm and leg numbness and heaviness  HPI: Rebecca Bright is an 24 y.o. female with ITP and Lupus, on Promacta (a thrombopoietin-receptor agonist) and Plaquenil, who presented to the [facility] ED with a chief complaint of LUE and LLE numbness and weakness starting at 1800 on Thursday. The symptoms were gradual in onset and were associated with difficulty ambulating. She also was somewhat confused and walked to the wrong apartment at some point. She was also bumping into things. The leg felt as though it was falling asleep. The above is in the context of a 1 week history of mild throbbing headache and jaw pain, with radiation to the back of her neck - she felt that this was due to bruxism. She denied chest pain, SOB, N/V, productive cough, saddle anesthesia, abdominal pain, diarrhea and B/B incontinence.   CT head at the [facility] ED showed a medium-sized mass-like lesion in the right parietal lobe with a large amount of surrounding edema, concerning for a subacute hemorrhage.   She was transferred to the neuro ICU at [facility] for further management by the Neurology team.   Additional history obtained from Dr. Arlyss Queen note: "24 year old [place] female known history of lupus and ITP secondary to this followed by Dr. Gavin Pound of rheumatology as well as by Dr. Lindi Adie for the ITP diagnosed 2016 (on Promacta per him) she has had bone marrow biopsy and was treated in the [employer] where father was stationed with IVIG prednisone Rituxan had relapses in 2016 hospitalized and given steroids and IVIG she had been on dapsone in the past and went into more durable remission early 2020 01/27/2019 but relapsed with platelets in the 17 range she has so far been on Decadron 4 daily [held because platelets were good and 788 range] continues on Promacta. She tells me she has been in remission for over 6 months since she has been on Promacta-has been on Plaquenil prior to being seen by Dr.  Lenna Gilford."  EKG: Sinus tachycardia Right atrial enlargement RSR' in V1 or V2, probably normal variant  Urine toxicology: Negative  Past Medical History:  Diagnosis Date  . History of ITP   . Lupus [facility])     Past Surgical History:  Procedure Laterality Date  . HERNIA REPAIR      Family History  Problem Relation Age of Onset  . Hypertension Mother   . Diabetes Father   . High Cholesterol Father    Social History:  reports that she has never smoked. She has never used smokeless tobacco. She reports that she does not drink alcohol and does not use drugs.  Allergies:  Allergies  Allergen Reactions  . Sulfa Antibiotics Other (See Comments)    Lupus increase   . Sulfur Other (See Comments)    Lupus Activity    Medications Prior to Admission  Medication Sig Dispense Refill  . acetaminophen (TYLENOL) 325 MG tablet Take 325-650 mg by mouth every 6 (six) hours as needed for mild pain or headache.    Marland Kitchen CAMILA 0.35 MG tablet Take 1 tablet by mouth daily.    . hydroxychloroquine (PLAQUENIL) 200 MG tablet TAKE 1 TABLET BY MOUTH EVERY DAY (Patient taking differently: Take 200 mg by mouth daily. ) 30 tablet 0  . PROMACTA 50 MG tablet TAKE 1 TABLET (50 MG TOTAL) BY MOUTH DAILY. TAKE ON AN EMPTY STOMACH 1 HOUR BEFORE A MEAL OR 2 HOURS AFTER (Patient taking differently: TAKE 1 TABLET (50 MG TOTAL)  BY MOUTH DAILY. TAKE ON AN EMPTY STOMACH 1 HOUR BEFORE A MEAL OR 2 HOURS AFTER) 30 tablet 6    ROS: As per HPI. Comprehensive ROS otherwise negative.   Physical Examination: Blood pressure (!) 132/91, pulse (!) 108, temperature 99.4 F (37.4 C), temperature source Oral, resp. rate (!) 21, height _0  (1.473 m), weight 38.1 kg, last menstrual period 04/16/2020, SpO2 100 %.  HEENT-  [place]/AT Lungs - Respirations unlabored Extremities - No edema. No ecchymosis or petechiae seen.   Neurologic Examination: Mental Status: Alert, fully oriented, thought content appropriate.  Speech fluent without  evidence of aphasia.  Able to follow all commands without difficulty. Cranial Nerves: II:  Visual fields intact. No extinction to DSS. PERRL.  III,IV, VI: No ptosis. EOMI.  V,VII: Temp sensation equal bilaterally. Face symmetric.  VIII: Hearing intact to voice IX,X: No hypophonia XI: Symmetric shoulder shrug XII: Midline tongue extension  Motor: Right : Upper extremity   5/5    Left:     Upper extremity   5/5  Lower extremity   5/5     Lower extremity   5/5 No pronator drift.  Sensory: Temp sensation decreased to LUE and LLE. FT decreased on the left as well. No extinction to DSS.  Deep Tendon Reflexes:  2+ BUE and patellae Toes downgoing bilaterally Cerebellar: No ataxia with FNF bilaterally  Gait: Deferred   Results for orders placed or performed during the hospital encounter of 04/24/20 (from the past 48 hour(s))  Urine rapid drug screen (hosp performed)     Status: None   Collection Time: 04/24/20 11:30 AM  Result Value Ref Range   Opiates NONE DETECTED NONE DETECTED   Cocaine NONE DETECTED NONE DETECTED   Benzodiazepines NONE DETECTED NONE DETECTED   Amphetamines NONE DETECTED NONE DETECTED   Tetrahydrocannabinol NONE DETECTED NONE DETECTED   Barbiturates NONE DETECTED NONE DETECTED    Comment: (NOTE) DRUG SCREEN FOR MEDICAL PURPOSES ONLY.  IF CONFIRMATION IS NEEDED FOR ANY PURPOSE, NOTIFY LAB WITHIN 5 DAYS.  LOWEST DETECTABLE LIMITS FOR URINE DRUG SCREEN Drug Class                     Cutoff (ng/mL) Amphetamine and metabolites    1000 Barbiturate and metabolites    200 Benzodiazepine                 102 Tricyclics and metabolites     300 Opiates and metabolites        300 Cocaine and metabolites        300 THC                            50 Performed at [facility], [place] [address] [place], [place] [address]   Ethanol     Status: None   Collection Time: 04/24/20 11:43 AM  Result Value Ref Range   Alcohol, Ethyl (B) <10 <10 mg/dL     Comment: (NOTE) Lowest detectable limit for serum alcohol is 10 mg/dL.  For medical purposes only. Performed at [facility], [place] [address] [place], [place] [address]   C-reactive protein     Status: Abnormal   Collection Time: 04/24/20 11:43 AM  Result Value Ref Range   CRP 11.2 (H) <1.0 mg/dL    Comment: Performed at [facility], [place] [address], [place], [place] [address]  Comprehensive metabolic panel  Status: Abnormal   Collection Time: 04/24/20 11:44 AM  Result Value Ref Range   Sodium 136 135 - 145 mmol/L   Potassium 3.5 3.5 - 5.1 mmol/L   Chloride 103 98 - 111 mmol/L   CO2 24 22 - 32 mmol/L   Glucose, Bld 120 (H) 70 - 99 mg/dL    Comment: Glucose reference range applies only to samples taken after fasting for at least 8 hours.   BUN 10 6 - 20 mg/dL   Creatinine, Ser 0.72 0.44 - 1.00 mg/dL   Calcium 9.2 8.9 - 10.3 mg/dL   Total Protein 9.7 (H) 6.5 - 8.1 g/dL   Albumin 4.4 3.5 - 5.0 g/dL   AST 20 15 - 41 U/L   ALT 14 0 - 44 U/L   Alkaline Phosphatase 58 38 - 126 U/L   Total Bilirubin <0.1 (L) 0.3 - 1.2 mg/dL    Comment: REPEATED TO VERIFY   GFR calc non Af Amer >60 >60 mL/min   GFR calc Af Amer >60 >60 mL/min   Anion gap 9 5 - 15    Comment: Performed at [facility], [place] [address], [place], [place] [address]  CBC with Differential     Status: Abnormal   Collection Time: 04/24/20 11:44 AM  Result Value Ref Range   WBC 8.5 4.0 - 10.5 K/uL   RBC 5.18 (H) 3.87 - 5.11 MIL/uL   Hemoglobin 9.3 (L) 12.0 - 15.0 g/dL   HCT 33.7 (L) 36 - 46 %   MCV 65.1 (L) 80.0 - 100.0 fL   MCH 18.0 (L) 26.0 - 34.0 pg   MCHC 27.6 (L) 30.0 - 36.0 g/dL   RDW 18.4 (H) 11.5 - 15.5 %   Platelets 461 (H) 150 - 400 K/uL   nRBC 0.0 0.0 - 0.2 %   Neutrophils Relative % 72 %   Neutro Abs 6.1 1.7 - 7.7 K/uL   Lymphocytes Relative 22 %   Lymphs Abs 1.9 0.7 - 4.0 K/uL   Monocytes Relative 5 %   Monocytes Absolute 0.4 0 - 1 K/uL    Eosinophils Relative 1 %   Eosinophils Absolute 0.1 0 - 0 K/uL   Basophils Relative 0 %   Basophils Absolute 0.0 0 - 0 K/uL   Immature Granulocytes 0 %   Abs Immature Granulocytes 0.03 0.00 - 0.07 K/uL    Comment: Performed at [facility], [place] [address], [place], [place] [address]  Sedimentation rate     Status: Abnormal   Collection Time: 04/24/20 11:44 AM  Result Value Ref Range   Sed Rate 62 (H) 0 - 22 mm/hr    Comment: Performed at [facility], [place] [address], [place], [place] [address]  Protime-INR     Status: None   Collection Time: 04/24/20 11:44 AM  Result Value Ref Range   Prothrombin Time 13.6 11.4 - 15.2 seconds   INR 1.1 0.8 - 1.2    Comment: (NOTE) INR goal varies based on device and disease states. Performed at [facility], [place] [address], [place], [place] [address]   APTT     Status: Abnormal   Collection Time: 04/24/20 11:44 AM  Result Value Ref Range   aPTT 47 (H) 24 - 36 seconds    Comment:        IF BASELINE aPTT IS ELEVATED, SUGGEST PATIENT RISK ASSESSMENT BE USED TO DETERMINE APPROPRIATE ANTICOAGULANT THERAPY. Performed at [facility], [place]  [address] [place], [place] [address]   I-Stat beta hCG blood, ED     Status: None   Collection Time: 04/24/20 11:50 AM  Result Value Ref Range   I-stat hCG, quantitative <5.0 <5 mIU/mL   Comment 3            Comment:   GEST. AGE      CONC.  (mIU/mL)   <=1 WEEK        5 - 50     2 WEEKS       50 - 500     3 WEEKS       100 - 10,000     4 WEEKS     1,000 - 30,000        FEMALE AND NON-PREGNANT FEMALE:     LESS THAN 5 mIU/mL   CBG monitoring, ED     Status: Abnormal   Collection Time: 04/24/20 11:54 AM  Result Value Ref Range   Glucose-Capillary 106 (H) 70 - 99 mg/dL    Comment: Glucose reference range applies only to samples taken after fasting for at least 8 hours.  Urinalysis, Routine w reflex microscopic Urine, Clean Catch      Status: Abnormal   Collection Time: 04/24/20 12:21 PM  Result Value Ref Range   Color, Urine YELLOW YELLOW   APPearance CLEAR CLEAR   Specific Gravity, Urine 1.027 1.005 - 1.030   pH 5.0 5.0 - 8.0   Glucose, UA NEGATIVE NEGATIVE mg/dL   Hgb urine dipstick SMALL (A) NEGATIVE   Bilirubin Urine NEGATIVE NEGATIVE   Ketones, ur 5 (A) NEGATIVE mg/dL   Protein, ur NEGATIVE NEGATIVE mg/dL   Nitrite NEGATIVE NEGATIVE   Leukocytes,Ua NEGATIVE NEGATIVE   RBC / HPF 0-5 0 - 5 RBC/hpf   WBC, UA 0-5 0 - 5 WBC/hpf   Bacteria, UA RARE (A) NONE SEEN   Squamous Epithelial / LPF 0-5 0 - 5   Mucus PRESENT     Comment: Performed at [facility], [place] [address] [place], [place] [address]  SARS Coronavirus 2 by RT PCR (hospital order, performed in [facility] hospital lab) Nasopharyngeal Nasopharyngeal Swab     Status: None   Collection Time: 04/24/20  4:39 PM   Specimen: Nasopharyngeal Swab  Result Value Ref Range   SARS Coronavirus 2 NEGATIVE NEGATIVE    Comment: (NOTE) SARS-CoV-2 target nucleic acids are NOT DETECTED.  The SARS-CoV-2 RNA is generally detectable in upper and lower respiratory specimens during the acute phase of infection. The lowest concentration of SARS-CoV-2 viral copies this assay can detect is 250 copies / mL. A negative result does not preclude SARS-CoV-2 infection and should not be used as the sole basis for treatment or other patient management decisions.  A negative result may occur with improper specimen collection / handling, submission of specimen other than nasopharyngeal swab, presence of viral mutation(s) within the areas targeted by this assay, and inadequate number of viral copies (<250 copies / mL). A negative result must be combined with clinical observations, patient history, and epidemiological information.  Fact Sheet for Patients:   StrictlyIdeas.no  Fact Sheet for Healthcare  Providers: BankingDealers.co.za  This test is not yet approved or  cleared by the [place] FDA and has been authorized for detection and/or diagnosis of SARS-CoV-2 by FDA under an Emergency Use Authorization (EUA).  This EUA will remain in effect (meaning this test can be used) for the duration of the COVID-19 declaration under Section 564(b)(1) of  the Act, 21 U.S.C. section 360bbb-3(b)(1), unless the authorization is terminated or revoked sooner.  Performed at [facility], [place] [address] [place], [place], [place] [address]    CT Head Wo Contrast  Result Date: 04/24/2020 CLINICAL DATA:  Numbness and heaviness of the LEFT leg EXAM: CT HEAD WITHOUT CONTRAST TECHNIQUE: Contiguous axial images were obtained from the base of the skull through the vertex without intravenous contrast. COMPARISON:  None. FINDINGS: Brain: There is masslike hypodensity within the RIGHT frontoparietal vertex. There is central punctate hyperdensity within this white matter hypodensity. This spans approximately 4 cm. Vascular: No hyperdense vessel or unexpected calcification. Skull: Normal. Negative for fracture or focal lesion. Sinuses/Orbits: No acute finding. Other: None. IMPRESSION: There is masslike hypodensity within the RIGHT frontoparietal vertex. There is central punctate hyperdensity within this white matter hypodensity. This spans approximately 4 cm. Findings are concerning for a mass with associated hemorrhage. Infarction is felt less likely but venous infarction is on the differential. Recommend further evaluation with contrast-enhanced MRI of the brain. These results were called by telephone at the time of interpretation on 04/24/2020 at 12:16 pm to provider [facility] , who verbally acknowledged these results. Electronically Signed   By: Valentino Saxon MD   On: 04/24/2020 12:20   MR ANGIO HEAD WO CONTRAST  Addendum Date: 04/24/2020   ADDENDUM REPORT: 04/24/2020 16:55  ADDENDUM: Area of hemorrhage measures approximately 5.5 x 3.1 x 5.2 cm on SWI. There are a few small areas of intrinsic T1 shortening reflecting subacute blood products. However, the majority of the hemorrhage appears to be acute despite lack of significant hyperdensity on CT. Electronically Signed   By: Macy Mis M.D.   On: 04/24/2020 16:55   Result Date: 04/24/2020 CLINICAL DATA:  Left-sided weakness and numbness with abnormal CT, per EMR history of ITP secondary to lupus EXAM: MRI HEAD WITHOUT AND WITH CONTRAST MRA HEAD WITHOUT CONTRAST MRA NECK WITHOUT AND WITH CONTRAST TECHNIQUE: Multiplanar, multiecho pulse sequences of the brain and surrounding structures were obtained without and with intravenous contrast. Angiographic images of the Circle of Willis were obtained using MRA technique without intravenous contrast. Angiographic images of the neck were obtained using MRA technique without and with intravenous contrast. Carotid stenosis measurements (when applicable) are obtained utilizing NASCET criteria, using the distal internal carotid diameter as the denominator. CONTRAST:  17m GADAVIST GADOBUTROL 1 MMOL/ML IV SOLN COMPARISON:  Correlation made with same day CT FINDINGS: MRI HEAD Brain: Heterogeneous signal is identified along the parasagittal right parietal lobe with moderate surrounding edema. There is marked corresponding susceptibility reflecting blood products. Regional mass effect is present. There is no evidence of intraventricular extension. There is no abnormal enhancement. A punctate focus of susceptibility along the right caudate likely reflects a chronic microhemorrhage. Vascular: Major vessel flow voids at the skull base are preserved. Skull and upper cervical spine: Normal marrow signal is preserved. Sinuses/Orbits: Paranasal sinuses are aerated. Orbits are unremarkable. Other: Sella is unremarkable.  Mastoid air cells are clear. MRA HEAD Intracranial internal carotid arteries are  patent. Middle and anterior cerebral arteries are patent. Intracranial vertebral arteries, basilar artery, posterior cerebral arteries are patent. There is no significant stenosis or aneurysm. MRA NECK Great vessel origins are patent. Common, internal, and external carotid arteries are patent. Extracranial vertebral arteries are patent. There is no measurable stenosis. IMPRESSION: Large area of heterogeneous signal along the parasagittal right parietal lobe with surrounding edema and mild regional mass effect. Appearance is most consistent with a parenchymal hematoma  without evidence of underlying lesion. Unremarkable vascular imaging. Electronically Signed: By: Macy Mis M.D. On: 04/24/2020 15:25   MR Angiogram Neck W or Wo Contrast  Addendum Date: 04/24/2020   ADDENDUM REPORT: 04/24/2020 16:55 ADDENDUM: Area of hemorrhage measures approximately 5.5 x 3.1 x 5.2 cm on SWI. There are a few small areas of intrinsic T1 shortening reflecting subacute blood products. However, the majority of the hemorrhage appears to be acute despite lack of significant hyperdensity on CT. Electronically Signed   By: Macy Mis M.D.   On: 04/24/2020 16:55   Result Date: 04/24/2020 CLINICAL DATA:  Left-sided weakness and numbness with abnormal CT, per EMR history of ITP secondary to lupus EXAM: MRI HEAD WITHOUT AND WITH CONTRAST MRA HEAD WITHOUT CONTRAST MRA NECK WITHOUT AND WITH CONTRAST TECHNIQUE: Multiplanar, multiecho pulse sequences of the brain and surrounding structures were obtained without and with intravenous contrast. Angiographic images of the Circle of Willis were obtained using MRA technique without intravenous contrast. Angiographic images of the neck were obtained using MRA technique without and with intravenous contrast. Carotid stenosis measurements (when applicable) are obtained utilizing NASCET criteria, using the distal internal carotid diameter as the denominator. CONTRAST:  41m GADAVIST GADOBUTROL 1  MMOL/ML IV SOLN COMPARISON:  Correlation made with same day CT FINDINGS: MRI HEAD Brain: Heterogeneous signal is identified along the parasagittal right parietal lobe with moderate surrounding edema. There is marked corresponding susceptibility reflecting blood products. Regional mass effect is present. There is no evidence of intraventricular extension. There is no abnormal enhancement. A punctate focus of susceptibility along the right caudate likely reflects a chronic microhemorrhage. Vascular: Major vessel flow voids at the skull base are preserved. Skull and upper cervical spine: Normal marrow signal is preserved. Sinuses/Orbits: Paranasal sinuses are aerated. Orbits are unremarkable. Other: Sella is unremarkable.  Mastoid air cells are clear. MRA HEAD Intracranial internal carotid arteries are patent. Middle and anterior cerebral arteries are patent. Intracranial vertebral arteries, basilar artery, posterior cerebral arteries are patent. There is no significant stenosis or aneurysm. MRA NECK Great vessel origins are patent. Common, internal, and external carotid arteries are patent. Extracranial vertebral arteries are patent. There is no measurable stenosis. IMPRESSION: Large area of heterogeneous signal along the parasagittal right parietal lobe with surrounding edema and mild regional mass effect. Appearance is most consistent with a parenchymal hematoma without evidence of underlying lesion. Unremarkable vascular imaging. Electronically Signed: By: PMacy MisM.D. On: 04/24/2020 15:25   MR Brain W and Wo Contrast  Addendum Date: 04/24/2020   ADDENDUM REPORT: 04/24/2020 16:55 ADDENDUM: Area of hemorrhage measures approximately 5.5 x 3.1 x 5.2 cm on SWI. There are a few small areas of intrinsic T1 shortening reflecting subacute blood products. However, the majority of the hemorrhage appears to be acute despite lack of significant hyperdensity on CT. Electronically Signed   By: PMacy MisM.D.    On: 04/24/2020 16:55   Result Date: 04/24/2020 CLINICAL DATA:  Left-sided weakness and numbness with abnormal CT, per EMR history of ITP secondary to lupus EXAM: MRI HEAD WITHOUT AND WITH CONTRAST MRA HEAD WITHOUT CONTRAST MRA NECK WITHOUT AND WITH CONTRAST TECHNIQUE: Multiplanar, multiecho pulse sequences of the brain and surrounding structures were obtained without and with intravenous contrast. Angiographic images of the Circle of Willis were obtained using MRA technique without intravenous contrast. Angiographic images of the neck were obtained using MRA technique without and with intravenous contrast. Carotid stenosis measurements (when applicable) are obtained utilizing NASCET criteria, using the distal internal  carotid diameter as the denominator. CONTRAST:  29m GADAVIST GADOBUTROL 1 MMOL/ML IV SOLN COMPARISON:  Correlation made with same day CT FINDINGS: MRI HEAD Brain: Heterogeneous signal is identified along the parasagittal right parietal lobe with moderate surrounding edema. There is marked corresponding susceptibility reflecting blood products. Regional mass effect is present. There is no evidence of intraventricular extension. There is no abnormal enhancement. A punctate focus of susceptibility along the right caudate likely reflects a chronic microhemorrhage. Vascular: Major vessel flow voids at the skull base are preserved. Skull and upper cervical spine: Normal marrow signal is preserved. Sinuses/Orbits: Paranasal sinuses are aerated. Orbits are unremarkable. Other: Sella is unremarkable.  Mastoid air cells are clear. MRA HEAD Intracranial internal carotid arteries are patent. Middle and anterior cerebral arteries are patent. Intracranial vertebral arteries, basilar artery, posterior cerebral arteries are patent. There is no significant stenosis or aneurysm. MRA NECK Great vessel origins are patent. Common, internal, and external carotid arteries are patent. Extracranial vertebral arteries are  patent. There is no measurable stenosis. IMPRESSION: Large area of heterogeneous signal along the parasagittal right parietal lobe with surrounding edema and mild regional mass effect. Appearance is most consistent with a parenchymal hematoma without evidence of underlying lesion. Unremarkable vascular imaging. Electronically Signed: By: PMacy MisM.D. On: 04/24/2020 15:25    Assessment: 24year old female with history of ITP, on Promacta and Plaquenil, presenting with numbness and heaviness of her LUE and LLE since 1800 on Thursday. Has had headache for 1 week. CT head revealed a medium-sized right parieto-frontal ICH with surrounding edema.  1. Exam reveals sensory deficits on the left. No focal weakness noted.  2. CT head: Masslike hypodensity within the RIGHT cerebral hemisphere near/at the frontoparietal vertex. There is central punctate hyperdensity within this white matter hypodensity. Total size of mass and surrounding edema spans approximately 4 cm. Findings are concerning for a mass with associated hemorrhage. Infarction is felt less likely but venous infarction is on the differential.  3. MRI brain: Large area of heterogeneous signal along the parasagittal right parietal lobe with surrounding edema and mild regional mass effect. Appearance is most consistent with a parenchymal hematoma without evidence of underlying lesion. 4. MRA of head and neck: Patent intracranial and extracranial circulation.  5. Venous infarctions can present with the imaging features above. Will need to obtain MR venogram with and without contrast to further assess. Although ITP is classically associated with an increased tendency to bleed, numerous case reports document an increased risk of thrombosis as well, manifesting as cerebral venous sinus thrombosis as well as stroke.  6. Dr. CSaintclair Halstedof Neurosurgery has reviewed the imaging and felt that this is not likely needing emergent surgical intervention. 7. CRP  elevated. Platelets elevated at 463. ESR elevated at 62. PTT elevated at 47.  8. HCG is negative.   Plan: 1. Admit to ICU under Neurology service 2. Repeat CT head at 3 AM 3. MR venogram of brain with and without contrast.  4. Heme-Onc consult in the AM to determine if IVIG or steroids should be started.  5. May need Rheumatology consult. 6. No antiplatelet medications or anticoagulants. DVT prophylaxis with SCDs.  7. TTE 8. PT consult, OT consult, Speech consult 9. Cardiac telemetry 10. Frequent neuro checks 11. BP management with clevidipine drip. SBP goal < 140  12. No antiplatelet medications or anticoagulants. DVT prophylaxis with SCDs 13. MRV of brain  Addendum: MRV negative for venous sinus thrombosis  Electronically signed: Dr. EKerney Elbe8/13/2021, 7:30  PM   

## 2020-04-25 ENCOUNTER — Inpatient Hospital Stay (HOSPITAL_COMMUNITY): Payer: BC Managed Care – PPO

## 2020-04-25 DIAGNOSIS — I6389 Other cerebral infarction: Secondary | ICD-10-CM

## 2020-04-25 DIAGNOSIS — G936 Cerebral edema: Secondary | ICD-10-CM

## 2020-04-25 LAB — ECHOCARDIOGRAM COMPLETE
Area-P 1/2: 3.99 cm2
Height: 58 in
S' Lateral: 2.6 cm
Weight: 1344 oz

## 2020-04-25 MED ORDER — PANTOPRAZOLE SODIUM 40 MG PO TBEC
40.0000 mg | DELAYED_RELEASE_TABLET | Freq: Every day | ORAL | Status: DC
  Administered 2020-04-25 – 2020-04-27 (×3): 40 mg via ORAL
  Filled 2020-04-25 (×3): qty 1

## 2020-04-25 NOTE — Progress Notes (Signed)
  Echocardiogram 2D Echocardiogram has been performed.  Celene Skeen 04/25/2020, 10:17 AM

## 2020-04-25 NOTE — Progress Notes (Signed)
OT Cancellation Note  Patient Details Name: Taheera Thomann MRN: [id number] DOB: 1996-08-23   Cancelled Treatment:    Reason Eval/Treat Not Completed: Active bedrest order.   Thornell Mule, OT/L   Acute OT Clinical Specialist Acute Rehabilitation Services Pager [phone] Office [phone]  04/25/2020, 7:40 AM

## 2020-04-25 NOTE — Progress Notes (Signed)
SLP Cancellation Note  Patient Details Name: Rebecca Bright MRN: [id number] DOB: 01/17/1996   Cancelled treatment:       Reason Eval/Treat Not Completed: SLP screened, no needs identified, will sign off   Angela Nevin, MA, CCC-SLP 04/25/20 10:46 AM

## 2020-04-25 NOTE — Progress Notes (Signed)
STROKE TEAM PROGRESS NOTE   HISTORY OF PRESENT ILLNESS (per record) Rebecca Bright is an 24 y.o. female with ITP and Lupus, on Promacta (a thrombopoietin-receptor agonist) and Plaquenil, who presented to the [facility] ED with a chief complaint of LUE and LLE numbness and weakness starting at 1800 on Thursday. The symptoms were gradual in onset and were associated with difficulty ambulating. She also was somewhat confused and walked to the wrong apartment at some point. She was also bumping into things. The leg felt as though it was falling asleep. The above is in the context of a 1 week history of mild throbbing headache and jaw pain, with radiation to the back of her neck - she felt that this was due to bruxism. She denied chest pain, SOB, N/V, productive cough, saddle anesthesia, abdominal pain, diarrhea and B/B incontinence.  CT head at the [facility] ED showed a medium-sized mass-like lesion in the right parietal lobe with a large amount of surrounding edema, concerning for a subacute hemorrhage.  She was transferred to the neuro ICU at [facility] for further management by the Neurology team.  Additional history obtained from Dr. Arlyss Queen note: "24 year old [place] female known history of lupus and ITP secondary to this followed by Dr. Gavin Pound of rheumatology as well as by Dr. Lindi Adie for the ITP diagnosed 2016 (on Promacta per him) she has had bone marrow biopsy and was treated in the [employer] where father was stationed with IVIG prednisone Rituxan had relapses in 2016 hospitalized and given steroids and IVIG she had been on dapsone in the past and went into more durable remission early 2020 01/27/2019 but relapsed with platelets in the 17 range she has so far been on Decadron 4 daily [held because platelets were good and 788 range]continues on Promacta. She tells me she has been in remission for over 6 months since she has been on Promacta-has been on Plaquenil prior to being seen by Dr. Lenna Gilford." EKG: Sinus  tachycardia Right atrial enlargement RSR' in V1 or V2, probably normal variant Urine toxicology: Negative   INTERVAL HISTORY I personally reviewed history of presenting illness with the patient, electronic medical records and imaging films and PACS.  Patient presented with several days of paresthesias and brain CT scan and MRI showed large right parietal parenchymal hemorrhage with mild cytotoxic edema.  Etiology is indeterminate and MR angiogram and MR venogram showed no evidence of large vessel stenosis, occlusion or venous sinus thrombosis.  Her blood pressure has never been elevated.  Urine drug screen was negative.  She has ITP which is stable on Promacta and Plaquenil and platelet count on admission was 4 61,000   OBJECTIVE Vitals:   04/25/20 0300 04/25/20 0400 04/25/20 0500 04/25/20 0600  BP: 105/64 104/69 110/75 (!) 98/56  Pulse:      Resp: _0 Temp:  98.3 F (36.8 C)    TempSrc:  Oral    SpO2:      Weight:      Height:        CBC:  Recent Labs  Lab 04/24/20 1144  WBC 8.5  NEUTROABS 6.1  HGB 9.3*  HCT 33.7*  MCV 65.1*  PLT 461*    Basic Metabolic Panel:  Recent Labs  Lab 04/24/20 1144  NA 136  K 3.5  CL 103  CO2 24  GLUCOSE 120*  BUN 10  CREATININE 0.72  CALCIUM 9.2    Lipid Panel: No results found for: CHOL, TRIG, HDL, CHOLHDL, VLDL, LDLCALC  HgbA1c: No results found for: HGBA1C Urine Drug Screen:     Component Value Date/Time   LABOPIA NONE DETECTED 04/24/2020 1130   COCAINSCRNUR NONE DETECTED 04/24/2020 1130   LABBENZ NONE DETECTED 04/24/2020 1130   AMPHETMU NONE DETECTED 04/24/2020 1130   THCU NONE DETECTED 04/24/2020 1130   LABBARB NONE DETECTED 04/24/2020 1130    Alcohol Level     Component Value Date/Time   ETH <10 04/24/2020 1143    IMAGING  CT Head Wo Contrast 04/24/2020 IMPRESSION:  There is masslike hypodensity within the RIGHT frontoparietal vertex. There is central punctate hyperdensity within this white matter  hypodensity. This spans approximately 4 cm. Findings are concerning for a mass with associated hemorrhage. Infarction is felt less likely but venous infarction is on the differential. Recommend further evaluation with contrast-enhanced MRI of the brain.    MR Brain W and Wo Contrast MR ANGIO HEAD WO CONTRAST MR Angiogram Neck W or Wo Contrast 04/24/2020   ADDENDUM: Area of hemorrhage measures approximately 5.5 x 3.1 x 5.2 cm on SWI. There are a few small areas of intrinsic T1 shortening reflecting subacute blood products. However, the majority of the hemorrhage appears to be acute despite lack of significant hyperdensity on CT. IMPRESSION:  Large area of heterogeneous signal along the parasagittal right parietal lobe with surrounding edema and mild regional mass effect. Appearance is most consistent with a parenchymal hematoma without evidence of underlying lesion. Unremarkable vascular imaging.     MR MRV HEAD W WO CONTRAST 04/24/2020 IMPRESSION:  Normal appearance of the dural venous sinuses. Sequela of evolving right parietal intraparenchymal hematoma.    Transthoracic Echocardiogram  00/00/2021 Pending  ECG - SR rate 99 BPM. Right atrial enlargement. (See cardiology reading for complete details)   PHYSICAL EXAM Blood pressure (!) 98/56, pulse (!) 110, temperature 98.3 F (36.8 C), temperature source Oral, resp. rate 20, height _0  (1.473 m), weight 38.1 kg, last menstrual period 04/16/2020, SpO2 100 %. Frail petite young [place] African lady not in distress.  . Afebrile. Head is nontraumatic. Neck is supple without bruit.    Cardiac exam no murmur or gallop. Lungs are clear to auscultation. Distal pulses are well felt. Neurological Exam ;  Awake  Alert oriented x 3. Normal speech and language.eye movements full without nystagmus.fundi were not visualized. Vision acuity and fields appear normal. Hearing is normal. Palatal movements are normal. Face symmetric. Tongue  midline. Normal strength, tone, reflexes and coordination. Normal sensation except mild subjective paresthesias in left leg.. Gait deferred.    ASSESSMENT/PLAN [name] is a 24 y.o. female with ITP and Lupus, on Promacta (a thrombopoietin-receptor agonist) and Plaquenil, who presented to the [facility] ED with a chief complaint of LUE and LLE numbness and weakness, somewhat confused with a 1 week history of mild throbbing headache and jaw pain, with radiation to the back of her neck. She did not receive IV t-PA due to [place].   Large area of heterogeneous signal along the parasagittal right parietal lobe with surrounding edema and mild regional mass effect. Most consistent with a parenchymal hematoma without evidence of underlying lesion.  Resultant left-sided paresthesias  Code Stroke CT Head - not ordered   CT head - There is masslike hypodensity within the RIGHT frontoparietal vertex. There is central punctate hyperdensity within this white matter hypodensity. This spans approximately 4 cm. Findings are concerning for a mass with associated hemorrhage. Infarction is felt less likely but venous infarction is on the differential  MRI head -  Large area of heterogeneous signal along the parasagittal right parietal lobe with surrounding edema and mild regional mass effect. Appearance is most consistent with a parenchymal hematoma without evidence of underlying lesion. Area of hemorrhage measures approximately 5.5 x 3.1 x 5.2 cm on SWI. There are a few small areas of intrinsic T1 shortening reflecting subacute blood products. However, the majority of the hemorrhage appears to be acute despite lack of significant hyperdensity on CT.  MRA head and neck - unremarkable  CTA H&N - not ordered  CT Perfusion - not ordered   MR MRV HEAD W WO CONTRAST - Normal appearance of the dural venous sinuses. Sequela of evolving right parietal intraparenchymal hematoma.   Carotid Doppler - MRA neck performed -  carotid dopplers not indicated.  2D Echo - pending  Lacey Jensen Virus 2 - negative  LDL - not ordered  HgbA1c - not ordered  UDS - negative  VTE prophylaxis - SCDs Diet  Diet Order            Diet regular Room service appropriate? Yes; Fluid consistency: Thin  Diet effective now                 No antithrombotic prior to admission, now on No antithrombotic  Ongoing aggressive stroke risk factor management  Therapy recommendations:  pending  Disposition:  Pending  Hypertension  Home BP meds: none   Current BP meds: Cleviprex prn  Blood pressure somewhat low (SBP 90's - 100's) . Long-term BP goal normotensive  Hyperlipidemia  Home Lipid lowering medication: none   LDL - not ordered, goal < 70  Current lipid lowering medication: none   ITP   Followed by Dr. Lindi Adie  Diagnosed in 2016    Decadron 4 daily [held because platelets were good and 788 range]continues on Promacta (eltromopag)  In remission for over 6 months  Lupus   Followed by Dr. Gavin Pound of rheumatology  Hydroxychloroquine (Palquinel)  Neurosurgery Consult - Dr Saintclair Halsted 8/13  Not convinced that this is entirely hematoma but did agree there may be some microhemorrhages.  Felt this was more than likely either an ischemic event with her history of ITP and lupus or the could be something some sort of mass underlying.   No indication for surgery at this point - recommend Medical Neurology evaluate and workup   Probably patient will need a repeat MRI scan 2-3 weeks may consider MRV.  Other Stroke Risk Factors  ITP  Lupus  Contraceptive (Norethindrone)  Other Active Problems  Code status - Full code   ITP   Lupus   Anemia - Hgb - 9.3  Sed rate - 62  ;  CRP - 11.2  Hospital day # 1 She has presented with several days of left-sided paresthesias from a subacute right parietal parenchymal hematoma etiology indeterminate with possibly small underlying vascular abnormality  not seen on the MRI.  ITP treatment with Promacta can cause clots but hemorrhage is usual.  Recommend close neurological monitoring and strict blood pressure control.  Mobilize out of bed.  Therapy consults.  Check diagnostic cerebral catheter angiogram on Monday to rule out any underlying vascular malformation.  May consider transfer out of ICU to neurological stepdown bed later this evening.  Long discussion patient and boyfriend at the bedside and answered questions.This patient is critically ill and at significant risk of neurological worsening, death and care requires constant monitoring of vital signs, hemodynamics,respiratory and cardiac monitoring, extensive review of multiple databases, frequent neurological assessment,  discussion with family, other specialists and medical decision making of high complexity.I have made any additions or clarifications directly to the above note.This critical care time does not reflect procedure time, or teaching time or supervisory time of PA/NP/Med Resident etc but could involve care discussion time.  I spent 30 minutes of neurocritical care time  in the care of  this patient.    Antony Contras, MD  To contact Stroke Continuity provider, please refer to [email or web address] After hours, contact General Neurology

## 2020-04-25 NOTE — Evaluation (Signed)
Physical Therapy Evaluation Patient Details Name: Rebecca Bright MRN: [id number] DOB: June 23, 1996 Today's Date: 04/25/2020   History of Present Illness  24 y.o. female with ITP and Lupus, on Promacta (a thrombopoietin-receptor agonist) and Plaquenil, who presented to the [facility] ED with a chief complaint of LUE and LLE numbness and weakness starting at 1800 on Thursday. The symptoms were gradual in onset and were associated with difficulty ambulating. She also was somewhat confused and walked to the wrong apartment at some point. CT head at the [facility] ED showed a medium-sized mass-like lesion in the right parietal lobe with a large amount of surrounding edema, concerning for a subacute hemorrhage.   Clinical Impression  Pt presents to PT with deficits in sensation, proprioception, functional mobility, gait, balance. Pt with L numbness and weakness, as well as LLE proprioception deficits. Pt with one loss of balance requiring minA to correct during dynamic gait activities, otherwise mobilizing without physical assistance. Pt will benefit from continued acute PT POC to improve balance and gait quality. PT recommends outpatient PT at this time to aide in a progression back to the pt's prior level of function.    Follow Up Recommendations Outpatient PT;Supervision - Intermittent    Equipment Recommendations  None recommended by PT    Recommendations for Other Services       Precautions / Restrictions Precautions Precautions: Fall Restrictions Weight Bearing Restrictions: No      Mobility  Bed Mobility Overal bed mobility: Modified Independent                Transfers Overall transfer level: Needs assistance Equipment used: None Transfers: Sit to/from Stand Sit to Stand: Supervision            Ambulation/Gait Ambulation/Gait assistance: Supervision;Min assist Gait Distance (Feet): 300 Feet Assistive device: None Gait Pattern/deviations: Step-to pattern;Step-through  pattern;Drifts right/left Gait velocity: reduced Gait velocity interpretation: 1.31 - 2.62 ft/sec, indicative of limited community ambulator General Gait Details: pt initially with short shuffling gait which improves to step through gait with increased ambulation. Pt demonstrates increased lateral drift and experiences on loss of balance when performing vertical head turns requiring minA to correct, otherwise pt performs horizontal head turns, changes gait speed, stops abruptly, all with close supervision  Stairs Stairs: Yes Stairs assistance: Min guard;Supervision Stair Management: One rail Right;Step to pattern;Alternating pattern Number of Stairs: 12 General stair comments: pt initially with minG and step to pattern, progresses to alternating with close supervision  Wheelchair Mobility    Modified Rankin (Stroke Patients Only) Modified Rankin (Stroke Patients Only) Pre-Morbid Rankin Score: No symptoms Modified Rankin: Moderately severe disability     Balance Overall balance assessment: Needs assistance Sitting-balance support: No upper extremity supported;Feet supported Sitting balance-Leahy Scale: Good     Standing balance support: No upper extremity supported Standing balance-Leahy Scale: Fair                               Pertinent Vitals/Pain Pain Assessment: Faces Faces Pain Scale: Hurts little more Pain Location: head Pain Descriptors / Indicators: Sore Pain Intervention(s): Monitored during session    Home Living Family/patient expects to be discharged to:: Private residence Living Arrangements: Alone Available Help at Discharge: Family;Available 24 hours/day Type of Home: Apartment Home Access: Stairs to enter Entrance Stairs-Rails: Can reach both Entrance Stairs-Number of Steps: flight Home Layout: One level Home Equipment: None      Prior Function Level of Independence: Independent  Comments: 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 [employer]        Extremity/Trunk Assessment   Upper Extremity Assessment Upper Extremity Assessment: LUE deficits/detail LUE Deficits / Details: Grossly 4/5    Lower Extremity Assessment Lower Extremity Assessment: LLE deficits/detail LLE Deficits / Details: Grossly 4/5 LLE Sensation: decreased light touch;decreased proprioception (proprioception intact ankle and up, not at great toe)    Cervical / Trunk Assessment Cervical / Trunk Assessment: Normal  Communication   Communication: No difficulties  Cognition Arousal/Alertness: Awake/alert Behavior During Therapy: WFL for tasks assessed/performed Overall Cognitive Status: Within Functional Limits for tasks assessed                                        General Comments General comments (skin integrity, edema, etc.): VSS on RA    Exercises     Assessment/Plan    PT Assessment Patient needs continued PT services  PT Problem List Decreased strength;Decreased balance;Decreased mobility;Impaired sensation       PT Treatment Interventions DME instruction;Gait training;Stair training;Functional mobility training;Balance training;Neuromuscular re-education;Patient/family education    PT Goals (Current goals can be found in the Care Plan section)  Acute Rehab PT Goals Patient Stated Goal: To return to independent mobility PT Goal Formulation: With patient Time For Goal Achievement: 05/09/20 Potential to Achieve Goals: Good Additional Goals Additional Goal #1: Pt will score >19/24 on DGI to indicate a low falls risk.    Frequency Min 4X/week   Barriers to discharge        Co-evaluation               AM-PAC PT "6 Clicks" Mobility  Outcome Measure Help needed turning from your back to your side while in a flat bed without using bedrails?: None Help needed moving from lying on your back to sitting on the side of a flat bed without using bedrails?:  None Help needed moving to and from a bed to a chair (including a wheelchair)?: None Help needed standing up from a chair using your arms (e.g., wheelchair or bedside chair)?: None Help needed to walk in hospital room?: A Little Help needed climbing 3-5 steps with a railing? : A Little 6 Click Score: 22    End of Session   Activity Tolerance: Patient tolerated treatment well Patient left: in chair;with call bell/phone within reach Nurse Communication: Mobility status PT Visit Diagnosis: Unsteadiness on feet (R26.81);Other abnormalities of gait and mobility (R26.89);Muscle weakness (generalized) (M62.81);Other symptoms and signs involving the nervous system (R29.898)    Time: 4034-7425 PT Time Calculation (min) (ACUTE ONLY): 24 min   Charges:   PT Evaluation $PT Eval Moderate Complexity: 1 Mod          Arlyss Gandy, PT, DPT Acute Rehabilitation Pager: [phone]   Arlyss Gandy 04/25/2020, 1:44 PM

## 2020-04-26 LAB — CBC WITH DIFFERENTIAL/PLATELET
Abs Immature Granulocytes: 0.01 10*3/uL (ref 0.00–0.07)
Basophils Absolute: 0 10*3/uL (ref 0.0–0.1)
Basophils Relative: 1 %
Eosinophils Absolute: 0.1 10*3/uL (ref 0.0–0.5)
Eosinophils Relative: 2 %
HCT: 32.4 % — ABNORMAL LOW (ref 36.0–46.0)
Hemoglobin: 8.8 g/dL — ABNORMAL LOW (ref 12.0–15.0)
Immature Granulocytes: 0 %
Lymphocytes Relative: 33 %
Lymphs Abs: 1.7 10*3/uL (ref 0.7–4.0)
MCH: 17.5 pg — ABNORMAL LOW (ref 26.0–34.0)
MCHC: 27.2 g/dL — ABNORMAL LOW (ref 30.0–36.0)
MCV: 64.3 fL — ABNORMAL LOW (ref 80.0–100.0)
Monocytes Absolute: 0.4 10*3/uL (ref 0.1–1.0)
Monocytes Relative: 8 %
Neutro Abs: 2.9 10*3/uL (ref 1.7–7.7)
Neutrophils Relative %: 56 %
Platelets: 404 10*3/uL — ABNORMAL HIGH (ref 150–400)
RBC: 5.04 MIL/uL (ref 3.87–5.11)
RDW: 18 % — ABNORMAL HIGH (ref 11.5–15.5)
WBC: 5.2 10*3/uL (ref 4.0–10.5)
nRBC: 0 % (ref 0.0–0.2)

## 2020-04-26 NOTE — Progress Notes (Signed)
STROKE TEAM PROGRESS NOTE      INTERVAL HISTORY Patient's father is at the bedside.  She informs me that she had a intracerebral hemorrhage about 5 to 7 days after getting a flu shot of Pfizerl vaccine and is wondering if there is a correlation.  I explained to her that most previous reported cases have been due to do [employer] or [employer] vaccine and those patients had thrombocytopenia which this patient clearly does not have as her last platelet count was greater than 400,000. And  MRI and MRV did not suggest venous sinus thrombosis. Neurologically she states her left-sided paresthesias seem to be improving she still has some visual spatial disorientation but was able to walk with some difficulty. Repeat platelet count today is stable at 414,000 OBJECTIVE Vitals:   04/26/20 0000 04/26/20 0109 04/26/20 0316 04/26/20 0832  BP: 98/64 112/76 124/89 105/68  Pulse: 80 75 85 73  Resp: 16 18 19 19   Temp:  98.6 F (37 C) 98.5 F (36.9 C) 97.9 F (36.6 C)  TempSrc:  Oral Oral Oral  SpO2: 100% 100% 100% 100%  Weight:      Height:        CBC:  Recent Labs  Lab 04/24/20 1144  WBC 8.5  NEUTROABS 6.1  HGB 9.3*  HCT 33.7*  MCV 65.1*  PLT 461*    Basic Metabolic Panel:  Recent Labs  Lab 04/24/20 1144  NA 136  K 3.5  CL 103  CO2 24  GLUCOSE 120*  BUN 10  CREATININE 0.72  CALCIUM 9.2    Lipid Panel: No results found for: CHOL, TRIG, HDL, CHOLHDL, VLDL, LDLCALC HgbA1c: No results found for: HGBA1C Urine Drug Screen:     Component Value Date/Time   LABOPIA NONE DETECTED 04/24/2020 1130   COCAINSCRNUR NONE DETECTED 04/24/2020 1130   LABBENZ NONE DETECTED 04/24/2020 1130   AMPHETMU NONE DETECTED 04/24/2020 1130   THCU NONE DETECTED 04/24/2020 1130   LABBARB NONE DETECTED 04/24/2020 1130    Alcohol Level     Component Value Date/Time   ETH <10 04/24/2020 1143    IMAGING  CT Head Wo Contrast 04/24/2020 IMPRESSION:  There is masslike hypodensity within the  RIGHT frontoparietal vertex. There is central punctate hyperdensity within this white matter hypodensity. This spans approximately 4 cm. Findings are concerning for a mass with associated hemorrhage. Infarction is felt less likely but venous infarction is on the differential. Recommend further evaluation with contrast-enhanced MRI of the brain.    MR Brain W and Wo Contrast MR ANGIO HEAD WO CONTRAST MR Angiogram Neck W or Wo Contrast 04/24/2020   ADDENDUM: Area of hemorrhage measures approximately 5.5 x 3.1 x 5.2 cm on SWI. There are a few small areas of intrinsic T1 shortening reflecting subacute blood products. However, the majority of the hemorrhage appears to be acute despite lack of significant hyperdensity on CT. IMPRESSION:  Large area of heterogeneous signal along the parasagittal right parietal lobe with surrounding edema and mild regional mass effect. Appearance is most consistent with a parenchymal hematoma without evidence of underlying lesion. Unremarkable vascular imaging.     MR MRV HEAD W WO CONTRAST 04/24/2020 IMPRESSION:  Normal appearance of the dural venous sinuses. Sequela of evolving right parietal intraparenchymal hematoma.    Transthoracic Echocardiogram  04/25/20 IMPRESSIONS  1. Left ventricular ejection fraction, by estimation, is 60 to 65%. The  left ventricle has normal function. The left ventricle has no regional  wall motion abnormalities. Left ventricular diastolic  parameters were  normal.  2. Right ventricular systolic function is normal. The right ventricular  size is normal. Tricuspid regurgitation signal is inadequate for assessing  PA pressure.  3. The mitral valve is grossly normal. No evidence of mitral valve  regurgitation. No evidence of mitral stenosis.  4. The aortic valve is tricuspid. Aortic valve regurgitation is not  visualized. No aortic stenosis is present.  5. The inferior vena cava is normal in size with greater than 50%   respiratory variability, suggesting right atrial pressure of 3 mmHg.    ECG - SR rate 99 BPM. Right atrial enlargement. (See cardiology reading for complete details)   PHYSICAL EXAM Blood pressure 105/68, pulse 73, temperature 97.9 F (36.6 C), temperature source Oral, resp. rate 19, height 4\' 10"  (1.473 m), weight 38.1 kg, last menstrual period 04/16/2020, SpO2 100 %. Frail petite young 06/16/2020 African lady not in distress. . Afebrile. Head is nontraumatic. Neck is supple without bruit.    Cardiac exam no murmur or gallop. Lungs are clear to auscultation. Distal pulses are well felt. Neurological Exam ;  Awake  Alert oriented x 3. Normal speech and language.eye movements full without nystagmus.fundi were not visualized. Vision acuity and fields appear normal. Hearing is normal. Palatal movements are normal. Face symmetric. Tongue midline. Normal strength, tone, reflexes and coordination. Normal sensation except mild subjective paresthesias in left leg.. Gait deferred.    ASSESSMENT/PLAN Ms. Jull Harral is a 24 y.o. female with ITP and Lupus, on Promacta (a thrombopoietin-receptor agonist) and Plaquenil, who presented to the [facility] ED with a chief complaint of LUE and LLE numbness and weakness, somewhat confused with a 1 week history of mild throbbing headache and jaw pain, with radiation to the back of her neck. She did not receive IV t-PA due to ICH.   Large area of heterogeneous signal along the parasagittal right parietal lobe with surrounding edema and mild regional mass effect. Most consistent with a parenchymal hematoma without evidence of underlying lesion.  Resultant left-sided paresthesias  Code Stroke CT Head - not ordered   CT head - There is masslike hypodensity within the RIGHT frontoparietal vertex. There is central punctate hyperdensity within this white matter hypodensity. This spans approximately 4 cm. Findings are concerning for a mass with associated hemorrhage.  Infarction is felt less likely but venous infarction is on the differential  MRI head - Large area of heterogeneous signal along the parasagittal right parietal lobe with surrounding edema and mild regional mass effect. Appearance is most consistent with a parenchymal hematoma without evidence of underlying lesion. Area of hemorrhage measures approximately 5.5 x 3.1 x 5.2 cm on SWI. There are a few small areas of intrinsic T1 shortening reflecting subacute blood products. However, the majority of the hemorrhage appears to be acute despite lack of significant hyperdensity on CT.  MRA head and neck - unremarkable  CTA H&N - not ordered  CT Perfusion - not ordered   MR MRV HEAD W WO CONTRAST - Normal appearance of the dural venous sinuses. Sequela of evolving right parietal intraparenchymal hematoma.   Carotid Doppler - MRA neck performed - carotid dopplers not indicated.  2D Echo - EF 60 - 65%. No cardiac source of emboli identified.   [facility] Virus 2 - negative  LDL - not ordered  HgbA1c - not ordered  UDS - negative  VTE prophylaxis - SCDs Diet  Diet Order            Diet NPO time specified  Except for: Sips with Meds  Diet effective midnight           Diet regular Room service appropriate? Yes; Fluid consistency: Thin  Diet effective now                 No antithrombotic prior to admission, now on No antithrombotic  Ongoing aggressive stroke risk factor management  Therapy recommendations:  Outpt PT recommended  Disposition:  Pending  Hypertension  Home BP meds: none   Current BP meds: Cleviprex prn  Blood pressure somewhat low (SBP 90's - 100's) . Long-term BP goal normotensive  Hyperlipidemia  Home Lipid lowering medication: none   LDL - not ordered, goal < 70  Current lipid lowering medication: none   ITP   Followed by Dr. Pamelia Hoit  Diagnosed in 2016    Decadron 4 daily [held because platelets were good and 788 range]continues on Promacta  (eltromopag)  In remission for over 6 months  Lupus   Followed by Dr. Zenovia Jordan of rheumatology  Hydroxychloroquine (Palquinel)  Neurosurgery Consult - Dr Wynetta Emery 8/13  Not convinced that this is entirely hematoma but did agree there may be some microhemorrhages.  Felt this was more than likely either an ischemic event with her history of ITP and lupus or the could be something some sort of mass underlying.   No indication for surgery at this point - recommend Medical Neurology evaluate and workup   Probably patient will need a repeat MRI scan 2-3 weeks may consider MRV.  Other Stroke Risk Factors  ITP  Lupus  Contraceptive (Norethindrone)  Other Active Problems  Code status - Full code   ITP   Lupus   Anemia - Hgb - 9.3  Sed rate - 62  ;  CRP - 11.2  Hospital day # 2    PLAN  Check diagnostic cerebral catheter angiogram on Monday to rule out any underlying vascular malformation.   Continue close neurological monitoring and strict blood pressure control.  Long discussion patient and father and answered questions.   Greater than 50% time during this 25-minute visit was spent in counseling and coordination of care and discussion with patient and father and answering questions Delia Heady, MD      To contact Stroke Continuity provider, please refer to [email or web address]. After hours, contact General Neurology

## 2020-04-26 NOTE — Plan of Care (Signed)
  Problem: Self-Care: Goal: Ability to participate in self-care as condition permits will improve Outcome: Progressing   Problem: Nutrition: Goal: Adequate nutrition will be maintained Outcome: Progressing   

## 2020-04-26 NOTE — Progress Notes (Signed)
Pt  Was transferred via w/c from 4N ICU  With  ITP and lupus.  Alert and oriented x 4 MAE with light weakness and numbness to LLE  and LUE. Oriented to room  Place on Tele and verified with CMT , SR with no acute distress. Vs stable. Pt oriented to room and made comfortsble in bed. Belonging and home medication with pt   Will continueto monitor pt.

## 2020-04-26 NOTE — Progress Notes (Signed)
PROGRESS NOTE    Rebecca Bright  QMV:[id number] DOB: 12/08/1995 DOA: 04/24/2020 PCP: Blenda Mounts, MD    Brief Narrative:  Patient admitted to the hospital with a working diagnosis of medium sized right parietal frontal intracerebral hemorrhage with surrounding edema.  24 year old female with significant past medical history for ITP and systemic lupus erythematous.  She reported left upper extremity and left lower extremity numbness and weakness for 24 hours prior to hospitalization.  Her symptoms were of gradual onset, associated with difficulty ambulating and mild confusion.  On her initial physical examination blood pressure 132/91, heart rate 108, temperature 99.4, respiratory 21, oxygen saturation 100%.  Her lungs were clear to auscultation bilaterally, heart S1-S2, present rhythmic, abdomen soft, no lower extremity edema, no rashes. Sodium 136, potassium 3.5, chloride 103, bicarb 24, glucose 120, BUN 10, creatinine 0.72, white count 8.5, hemoglobin 9.3, hematocrit 33.7, platelets 461.  SARS COVID-19 negative.  Urinalysis negative for infection.  Drug screen negative. CT with right frontoparietal hypodensity, 4 cm, likely hemorrhagic. Brain MRI with parenchymal hematoma along the parasagittal right parietal lobe with surrounding edema and mild regional mass-effect. EKG 99 bpm, normal axis, right bundle branch block, normal intervals, sinus rhythm, no ST segment or T wave changes.  Patient was admitted to the intensive care unit for close neurologic monitoring.  Neurosurgery was consulted, no indication for surgical intervention. Scheduled to have cerebral catheter angiogram tomorrow rule out underlying vascular malformation.  Transferred to [facility] on 04/26/20.   Assessment & Plan:   Active Problems:   Intracranial edema (HCC)   ICH (intracerebral hemorrhage) (HCC)   Intraparenchymal hematoma of brain (HCC)    1. New parenchymal hematoma at the right parietal lobe with local  edema and mild regional mass effect. Patient with no nausea or vomiting, headache controlled with acetaminophen. Patient ambulated with physical therapy.   Will continue close neurologic monitoring. Plan for cerebral catheter angiogram in am. Pending patient's consent.   2. ITP/ LUPUS systemic erythematous. No signs of acute flare. Continue with hydroxocholoquine   3. HTN. Continue blood pressure monitoring. Hold on IV fluids for now. Her blood pressure has remained stable 130/90 mmHg, off antihypertensive medications.   4. Dyslipidemia. Not on pharmacologic therapy.   Patient continue to be at high risk for worsening intracranial bleeding.   Status is: Inpatient  Remains inpatient appropriate because:Inpatient level of care appropriate due to severity of illness   Dispo: The patient is from: Home              Anticipated d/c is to: Home              Anticipated d/c date is: 2 days              Patient currently is not medically stable to d/c.   DVT prophylaxis: scd   Code Status:   full  Family Communication:  No family at the bedside      Consultants:   Neurology   Neurosurgery   IR       Subjective: Patient with mild headache, improved with oral analgesics, no nausea or vomiting, no chest pain. No dyspnea.   Objective: Vitals:   04/26/20 0000 04/26/20 0109 04/26/20 0316 04/26/20 0832  BP: 98/64 112/76 124/89 105/68  Pulse: 80 75 85 73  Resp: 16 18 19 19   Temp:  98.6 F (37 C) 98.5 F (36.9 C) 97.9 F (36.6 C)  TempSrc:  Oral Oral Oral  SpO2: 100% 100% 100% 100%  Weight:  Height:        Intake/Output Summary (Last 24 hours) at 04/26/2020 1242 Last data filed at 04/26/2020 0600 Gross per 24 hour  Intake 150 ml  Output --  Net 150 ml   Filed Weights   04/24/20 1011  Weight: 38.1 kg    Examination:   General: Not in pain or dyspnea. Deconditioned  Neurology: Awake and alert, non focal  E ENT: mild pallor, no icterus, oral mucosa  moist Cardiovascular: No JVD. S1-S2 present, rhythmic, no gallops, rubs, or murmurs. No lower extremity edema. Pulmonary: posirive breath sounds bilaterally,  Gastrointestinal. Abdomen soft and non tender Skin. No rashes Musculoskeletal: no joint deformities     Data Reviewed: I have personally reviewed following labs and imaging studies  CBC: Recent Labs  Lab 04/24/20 1144 04/26/20 0952  WBC 8.5 5.2  NEUTROABS 6.1 2.9  HGB 9.3* 8.8*  HCT 33.7* 32.4*  MCV 65.1* 64.3*  PLT 461* 404*   Basic Metabolic Panel: Recent Labs  Lab 04/24/20 1144  NA 136  K 3.5  CL 103  CO2 24  GLUCOSE 120*  BUN 10  CREATININE 0.72  CALCIUM 9.2   GFR: Estimated Creatinine Clearance: 65.2 mL/min (by C-G formula based on SCr of 0.72 mg/dL). Liver Function Tests: Recent Labs  Lab 04/24/20 1144  AST 20  ALT 14  ALKPHOS 58  BILITOT <0.1*  PROT 9.7*  ALBUMIN 4.4   No results for input(s): LIPASE, AMYLASE in the last 168 hours. No results for input(s): AMMONIA in the last 168 hours. Coagulation Profile: Recent Labs  Lab 04/24/20 1144  INR 1.1   Cardiac Enzymes: No results for input(s): CKTOTAL, CKMB, CKMBINDEX, TROPONINI in the last 168 hours. BNP (last 3 results) No results for input(s): PROBNP in the last 8760 hours. HbA1C: No results for input(s): HGBA1C in the last 72 hours. CBG: Recent Labs  Lab 04/24/20 1154  GLUCAP 106*   Lipid Profile: No results for input(s): CHOL, HDL, LDLCALC, TRIG, CHOLHDL, LDLDIRECT in the last 72 hours. Thyroid Function Tests: No results for input(s): TSH, T4TOTAL, FREET4, T3FREE, THYROIDAB in the last 72 hours. Anemia Panel: No results for input(s): VITAMINB12, FOLATE, FERRITIN, TIBC, IRON, RETICCTPCT in the last 72 hours.    Radiology Studies: I have reviewed all of the imaging during this hospital visit personally     Scheduled Meds: .  stroke: mapping our early stages of recovery book   Does not apply Once  . Chlorhexidine  Gluconate Cloth  6 each Topical Daily  . eltrombopag  50 mg Oral Daily  . hydroxychloroquine  200 mg Oral Daily  . norethindrone  1 tablet Oral Daily  . pantoprazole  40 mg Oral QHS  . senna-docusate  1 tablet Oral BID   Continuous Infusions: . sodium chloride Stopped (04/25/20 1032)  . clevidipine       LOS: 2 days        Orville Mena Annett Gula, MD

## 2020-04-26 NOTE — Consult Note (Signed)
Chief Complaint: Patient was seen in consultation today for ICH/diagnostic cerebral arteriogram.  Referring Physician(s): Micki Riley  Supervising Physician: Baldemar Lenis  Patient Status: Rebecca Bright  History of Present Illness: Rebecca Bright is a 24 y.o. female with a past medical history of ITP and lupus. She presented to [facility] ED 04/24/2020 with complaints of left-sided numbness and headache. In ED, CT head revealed a medium-sized mass-like lesion in the right parietal lobe with a large amount of surrounding edema, concerning for a subacute hemorrhage. She was transferred and admitted to [facility] for further management. Neurology was consulted who recommended NIR consult for possible diagnostic cerebral arteriogram to evaluate for vascular causes of ICH.  CT head 04/24/2020: 1. There is masslike hypodensity within the RIGHT frontoparietal vertex. There is central punctate hyperdensity within this white matter hypodensity. This spans approximately 4 cm. Findings are concerning for a mass with associated hemorrhage. Infarction is felt less likely but venous infarction is on the differential. Recommend further evaluation with contrast-enhanced MRI of the brain.  MR brain/MRA head and neck 04/24/2020: 1. Large area of heterogeneous signal along the parasagittal right parietal lobe with surrounding edema and mild regional mass effect. Appearance is most consistent with a parenchymal hematoma without evidence of underlying lesion. Area of hemorrhage measures approximately 5.5 x 3.1 x 5.2 cm on SWI. There are a few small areas of intrinsic T1 shortening reflecting subacute blood products. However, the majority of the hemorrhage appears to be acute despite lack of significant hyperdensity on CT. 2. Unremarkable vascular imaging.  MRV head 04/24/2020: 1. Normal appearance of the dural venous sinuses. 2. Sequela of evolving right parietal intraparenchymal hematoma.  NIR consulted by  Dr. Pearlean Brownie for possible image-guided diagnostic cerebral arteriogram. Patient awake and alert laying in bed. Complains of headache, slightly improved since admission. Denies fever, chills, chest pain, dyspnea, or abdominal pain.   Past Medical History:  Diagnosis Date  . History of ITP   . Lupus [facility])     Past Surgical History:  Procedure Laterality Date  . HERNIA REPAIR      Allergies: Sulfa antibiotics and Sulfur  Medications: Prior to Admission medications   Medication Sig Start Date End Date Taking? Authorizing Provider  acetaminophen (TYLENOL) 325 MG tablet Take 325-650 mg by mouth every 6 (six) hours as needed for mild pain or headache.   Yes [provider]  CAMILA 0.35 MG tablet Take 1 tablet by mouth daily. 02/06/20  Yes [provider]  hydroxychloroquine (PLAQUENIL) 200 MG tablet TAKE 1 TABLET BY MOUTH EVERY DAY Patient taking differently: Take 200 mg by mouth daily.  02/27/20  Yes Serena Croissant, MD  PROMACTA 50 MG tablet TAKE 1 TABLET (50 MG TOTAL) BY MOUTH DAILY. TAKE ON AN EMPTY STOMACH 1 HOUR BEFORE A MEAL OR 2 HOURS AFTER Patient taking differently: TAKE 1 TABLET (50 MG TOTAL) BY MOUTH DAILY. TAKE ON AN EMPTY STOMACH 1 HOUR BEFORE A MEAL OR 2 HOURS AFTER 04/16/20  Yes Serena Croissant, MD     Family History  Problem Relation Age of Onset  . Hypertension Mother   . Diabetes Father   . High Cholesterol Father     Social History   Socioeconomic History  . Marital status: Single    Spouse name: Not on file  . Number of children: Not on file  . Years of education: Not on file  . Highest education level: Not on file  Occupational History  . Not on file  Tobacco Use  . Smoking status: Never Smoker  . Smokeless tobacco: Never Used  Vaping Use  . Vaping Use: Never used  Substance and Sexual Activity  . Alcohol use: Never  . Drug use: Never  . Sexual activity: Not on file  Other Topics Concern  . Not on file  Social History Narrative  . Not  on file   Social Determinants of Health   Financial Resource Strain:   . Difficulty of Paying Living Expenses:   Food Insecurity:   . Worried About Programme researcher, broadcasting/film/video in the Last Year:   . Barista in the Last Year:   Transportation Needs:   . Freight forwarder (Medical):   Marland Kitchen Lack of Transportation (Non-Medical):   Physical Activity:   . Days of Exercise per Week:   . Minutes of Exercise per Session:   Stress:   . Feeling of Stress :   Social Connections:   . Frequency of Communication with Friends and Family:   . Frequency of Social Gatherings with Friends and Family:   . Attends Religious Services:   . Active Member of Clubs or Organizations:   . Attends Banker Meetings:   Marland Kitchen Marital Status:      Review of Systems: A 12 point ROS discussed and pertinent positives are indicated in the HPI above.  All other systems are negative.  Review of Systems  Constitutional: Negative for chills and fever.  Respiratory: Negative for shortness of breath and wheezing.   Cardiovascular: Negative for chest pain and palpitations.  Gastrointestinal: Negative for abdominal pain.  Neurological: Positive for headaches.  Psychiatric/Behavioral: Negative for behavioral problems and confusion.    Vital Signs: BP 105/68 (BP Location: Left Arm)   Pulse 73   Temp 97.9 F (36.6 C) (Oral)   Resp 19   Ht 4\' 10"  (1.473 m)   Wt 84 lb (38.1 kg)   LMP 04/16/2020   SpO2 100%   BMI 17.56 kg/m   Physical Exam Vitals and nursing note reviewed.  Constitutional:      General: She is not in acute distress.    Appearance: Normal appearance.  Cardiovascular:     Rate and Rhythm: Normal rate and regular rhythm.     Heart sounds: Normal heart sounds. No murmur heard.   Pulmonary:     Effort: Pulmonary effort is normal. No respiratory distress.     Breath sounds: Normal breath sounds. No wheezing.  Skin:    General: Skin is warm and dry.  Neurological:     Mental Status:  She is alert and oriented to person, place, and time.      MD Evaluation Airway: WNL Heart: WNL Abdomen: WNL Chest/ Lungs: WNL ASA  Classification: 2 Mallampati/Airway Score: One   Imaging: CT Head Wo Contrast  Result Date: 04/24/2020 CLINICAL DATA:  Numbness and heaviness of the LEFT leg EXAM: CT HEAD WITHOUT CONTRAST TECHNIQUE: Contiguous axial images were obtained from the base of the skull through the vertex without intravenous contrast. COMPARISON:  None. FINDINGS: Brain: There is masslike hypodensity within the RIGHT frontoparietal vertex. There is central punctate hyperdensity within this white matter hypodensity. This spans approximately 4 cm. Vascular: No hyperdense vessel or unexpected calcification. Skull: Normal. Negative for fracture or focal lesion. Sinuses/Orbits: No acute finding. Other: None. IMPRESSION: There is masslike hypodensity within the RIGHT frontoparietal vertex. There is central punctate hyperdensity within this white matter hypodensity. This spans approximately 4 cm. Findings are concerning for a  mass with associated hemorrhage. Infarction is felt less likely but venous infarction is on the differential. Recommend further evaluation with contrast-enhanced MRI of the brain. These results were called by telephone at the time of interpretation on 04/24/2020 at 12:16 pm to provider [facility] , who verbally acknowledged these results. Electronically Signed   By: Meda Klinefelter MD   On: 04/24/2020 12:20   MR ANGIO HEAD WO CONTRAST  Addendum Date: 04/24/2020   ADDENDUM REPORT: 04/24/2020 16:55 ADDENDUM: Area of hemorrhage measures approximately 5.5 x 3.1 x 5.2 cm on SWI. There are a few small areas of intrinsic T1 shortening reflecting subacute blood products. However, the majority of the hemorrhage appears to be acute despite lack of significant hyperdensity on CT. Electronically Signed   By: Guadlupe Spanish M.D.   On: 04/24/2020 16:55   Result Date:  04/24/2020 CLINICAL DATA:  Left-sided weakness and numbness with abnormal CT, per EMR history of ITP secondary to lupus EXAM: MRI HEAD WITHOUT AND WITH CONTRAST MRA HEAD WITHOUT CONTRAST MRA NECK WITHOUT AND WITH CONTRAST TECHNIQUE: Multiplanar, multiecho pulse sequences of the brain and surrounding structures were obtained without and with intravenous contrast. Angiographic images of the Circle of Willis were obtained using MRA technique without intravenous contrast. Angiographic images of the neck were obtained using MRA technique without and with intravenous contrast. Carotid stenosis measurements (when applicable) are obtained utilizing NASCET criteria, using the distal internal carotid diameter as the denominator. CONTRAST:  19mL GADAVIST GADOBUTROL 1 MMOL/ML IV SOLN COMPARISON:  Correlation made with same day CT FINDINGS: MRI HEAD Brain: Heterogeneous signal is identified along the parasagittal right parietal lobe with moderate surrounding edema. There is marked corresponding susceptibility reflecting blood products. Regional mass effect is present. There is no evidence of intraventricular extension. There is no abnormal enhancement. A punctate focus of susceptibility along the right caudate likely reflects a chronic microhemorrhage. Vascular: Major vessel flow voids at the skull base are preserved. Skull and upper cervical spine: Normal marrow signal is preserved. Sinuses/Orbits: Paranasal sinuses are aerated. Orbits are unremarkable. Other: Sella is unremarkable.  Mastoid air cells are clear. MRA HEAD Intracranial internal carotid arteries are patent. Middle and anterior cerebral arteries are patent. Intracranial vertebral arteries, basilar artery, posterior cerebral arteries are patent. There is no significant stenosis or aneurysm. MRA NECK Great vessel origins are patent. Common, internal, and external carotid arteries are patent. Extracranial vertebral arteries are patent. There is no measurable stenosis.  IMPRESSION: Large area of heterogeneous signal along the parasagittal right parietal lobe with surrounding edema and mild regional mass effect. Appearance is most consistent with a parenchymal hematoma without evidence of underlying lesion. Unremarkable vascular imaging. Electronically Signed: By: Guadlupe Spanish M.D. On: 04/24/2020 15:25   MR Angiogram Neck W or Wo Contrast  Addendum Date: 04/24/2020   ADDENDUM REPORT: 04/24/2020 16:55 ADDENDUM: Area of hemorrhage measures approximately 5.5 x 3.1 x 5.2 cm on SWI. There are a few small areas of intrinsic T1 shortening reflecting subacute blood products. However, the majority of the hemorrhage appears to be acute despite lack of significant hyperdensity on CT. Electronically Signed   By: Guadlupe Spanish M.D.   On: 04/24/2020 16:55   Result Date: 04/24/2020 CLINICAL DATA:  Left-sided weakness and numbness with abnormal CT, per EMR history of ITP secondary to lupus EXAM: MRI HEAD WITHOUT AND WITH CONTRAST MRA HEAD WITHOUT CONTRAST MRA NECK WITHOUT AND WITH CONTRAST TECHNIQUE: Multiplanar, multiecho pulse sequences of the brain and surrounding structures were obtained without and with  intravenous contrast. Angiographic images of the Circle of Willis were obtained using MRA technique without intravenous contrast. Angiographic images of the neck were obtained using MRA technique without and with intravenous contrast. Carotid stenosis measurements (when applicable) are obtained utilizing NASCET criteria, using the distal internal carotid diameter as the denominator. CONTRAST:  4mL GADAVIST GADOBUTROL 1 MMOL/ML IV SOLN COMPARISON:  Correlation made with same day CT FINDINGS: MRI HEAD Brain: Heterogeneous signal is identified along the parasagittal right parietal lobe with moderate surrounding edema. There is marked corresponding susceptibility reflecting blood products. Regional mass effect is present. There is no evidence of intraventricular extension. There is no  abnormal enhancement. A punctate focus of susceptibility along the right caudate likely reflects a chronic microhemorrhage. Vascular: Major vessel flow voids at the skull base are preserved. Skull and upper cervical spine: Normal marrow signal is preserved. Sinuses/Orbits: Paranasal sinuses are aerated. Orbits are unremarkable. Other: Sella is unremarkable.  Mastoid air cells are clear. MRA HEAD Intracranial internal carotid arteries are patent. Middle and anterior cerebral arteries are patent. Intracranial vertebral arteries, basilar artery, posterior cerebral arteries are patent. There is no significant stenosis or aneurysm. MRA NECK Great vessel origins are patent. Common, internal, and external carotid arteries are patent. Extracranial vertebral arteries are patent. There is no measurable stenosis. IMPRESSION: Large area of heterogeneous signal along the parasagittal right parietal lobe with surrounding edema and mild regional mass effect. Appearance is most consistent with a parenchymal hematoma without evidence of underlying lesion. Unremarkable vascular imaging. Electronically Signed: By: Guadlupe Spanish M.D. On: 04/24/2020 15:25   MR Brain W and Wo Contrast  Addendum Date: 04/24/2020   ADDENDUM REPORT: 04/24/2020 16:55 ADDENDUM: Area of hemorrhage measures approximately 5.5 x 3.1 x 5.2 cm on SWI. There are a few small areas of intrinsic T1 shortening reflecting subacute blood products. However, the majority of the hemorrhage appears to be acute despite lack of significant hyperdensity on CT. Electronically Signed   By: Guadlupe Spanish M.D.   On: 04/24/2020 16:55   Result Date: 04/24/2020 CLINICAL DATA:  Left-sided weakness and numbness with abnormal CT, per EMR history of ITP secondary to lupus EXAM: MRI HEAD WITHOUT AND WITH CONTRAST MRA HEAD WITHOUT CONTRAST MRA NECK WITHOUT AND WITH CONTRAST TECHNIQUE: Multiplanar, multiecho pulse sequences of the brain and surrounding structures were obtained without  and with intravenous contrast. Angiographic images of the Circle of Willis were obtained using MRA technique without intravenous contrast. Angiographic images of the neck were obtained using MRA technique without and with intravenous contrast. Carotid stenosis measurements (when applicable) are obtained utilizing NASCET criteria, using the distal internal carotid diameter as the denominator. CONTRAST:  4mL GADAVIST GADOBUTROL 1 MMOL/ML IV SOLN COMPARISON:  Correlation made with same day CT FINDINGS: MRI HEAD Brain: Heterogeneous signal is identified along the parasagittal right parietal lobe with moderate surrounding edema. There is marked corresponding susceptibility reflecting blood products. Regional mass effect is present. There is no evidence of intraventricular extension. There is no abnormal enhancement. A punctate focus of susceptibility along the right caudate likely reflects a chronic microhemorrhage. Vascular: Major vessel flow voids at the skull base are preserved. Skull and upper cervical spine: Normal marrow signal is preserved. Sinuses/Orbits: Paranasal sinuses are aerated. Orbits are unremarkable. Other: Sella is unremarkable.  Mastoid air cells are clear. MRA HEAD Intracranial internal carotid arteries are patent. Middle and anterior cerebral arteries are patent. Intracranial vertebral arteries, basilar artery, posterior cerebral arteries are patent. There is no significant stenosis or aneurysm. MRA NECK  Great vessel origins are patent. Common, internal, and external carotid arteries are patent. Extracranial vertebral arteries are patent. There is no measurable stenosis. IMPRESSION: Large area of heterogeneous signal along the parasagittal right parietal lobe with surrounding edema and mild regional mass effect. Appearance is most consistent with a parenchymal hematoma without evidence of underlying lesion. Unremarkable vascular imaging. Electronically Signed: By: Guadlupe Spanish M.D. On: 04/24/2020  15:25   MR MRV HEAD W WO CONTRAST  Result Date: 04/24/2020 CLINICAL DATA:  Headache EXAM: MR VENOGRAM HEAD WITHOUT AND WITH CONTRAST TECHNIQUE: Angiographic images of the intracranial venous structures were obtained using MRV technique without and with intravenous contrast. CONTRAST:  4mL GADAVIST GADOBUTROL 1 MMOL/ML IV SOLN COMPARISON:  04/24/2020 head CT.  04/24/2020 MRI/MRA head. FINDINGS: Normal appearance of the dural venous sinuses. No evidence of thrombosis or high-grade narrowing. No vascular malformation identified. No evidence of vascular injury involving the dural venous sinuses. Redemonstration of parasagittal intraparenchymal hematoma involving the right parietal region. IMPRESSION: Normal appearance of the dural venous sinuses. Sequela of evolving right parietal intraparenchymal hematoma. Electronically Signed   By: Stana Bunting M.D.   On: 04/24/2020 23:18   ECHOCARDIOGRAM COMPLETE  Result Date: 04/25/2020    ECHOCARDIOGRAM REPORT   Patient Name:   YMANI PORCHER Date of Exam: 04/25/2020 Medical Rec #:  [id number]       Height:       58.0 in Accession #:    [id number]      Weight:       84.0 lb Date of Birth:  [birth year]      BSA:          1.260 m Patient Age:    23 years        BP:           107/87 mmHg Patient Gender: F               HR:           81 bpm. Exam Location:  Inpatient Procedure: 2D Echo Indications:   stroke 434.91  History:       Patient has no prior history of Echocardiogram examinations.                Lupus.  Sonographer:   Celene Skeen RDCS (AE) Referring      [phone] ERIC LINDZEN Phys:  Sonographer Comments: petite stature made some images challenging IMPRESSIONS  1. Left ventricular ejection fraction, by estimation, is 60 to 65%. The left ventricle has normal function. The left ventricle has no regional wall motion abnormalities. Left ventricular diastolic parameters were normal.  2. Right ventricular systolic function is normal. The right ventricular size is normal.  Tricuspid regurgitation signal is inadequate for assessing PA pressure.  3. The mitral valve is grossly normal. No evidence of mitral valve regurgitation. No evidence of mitral stenosis.  4. The aortic valve is tricuspid. Aortic valve regurgitation is not visualized. No aortic stenosis is present.  5. The inferior vena cava is normal in size with greater than 50% respiratory variability, suggesting right atrial pressure of 3 mmHg. Conclusion(s)/Recommendation(s): Normal biventricular function without evidence of hemodynamically significant valvular heart disease. No intracardiac source of embolism detected on this transthoracic study. A transesophageal echocardiogram is recommended to exclude cardiac source of embolism if clinically indicated. FINDINGS  Left Ventricle: Left ventricular ejection fraction, by estimation, is 60 to 65%. The left ventricle has normal function. The left ventricle has no regional wall motion abnormalities. The left ventricular internal  cavity size was normal in size. There is  no left ventricular hypertrophy. Left ventricular diastolic parameters were normal. Right Ventricle: The right ventricular size is normal. No increase in right ventricular wall thickness. Right ventricular systolic function is normal. Tricuspid regurgitation signal is inadequate for assessing PA pressure. Left Atrium: Left atrial size was normal in size. Right Atrium: Right atrial size was normal in size. Pericardium: Trivial pericardial effusion is present. Mitral Valve: The mitral valve is grossly normal. No evidence of mitral valve regurgitation. No evidence of mitral valve stenosis. Tricuspid Valve: The tricuspid valve is grossly normal. Tricuspid valve regurgitation is not demonstrated. No evidence of tricuspid stenosis. Aortic Valve: The aortic valve is tricuspid. Aortic valve regurgitation is not visualized. No aortic stenosis is present. Pulmonic Valve: The pulmonic valve was grossly normal. Pulmonic valve  regurgitation is not visualized. No evidence of pulmonic stenosis. Aorta: The aortic root is normal in size and structure. Venous: The right upper pulmonary vein is normal. The inferior vena cava is normal in size with greater than 50% respiratory variability, suggesting right atrial pressure of 3 mmHg. IAS/Shunts: The atrial septum is grossly normal.  LEFT VENTRICLE PLAX 2D LVIDd:         3.40 cm  Diastology LVIDs:         2.60 cm  LV e' lateral:   11.30 cm/s LV PW:         0.60 cm  LV E/e' lateral: 9.6 LV IVS:        0.60 cm  LV e' medial:    13.70 cm/s LVOT diam:     1.60 cm  LV E/e' medial:  7.9 LV SV:         39 LV SV Index:   31 LVOT Area:     2.01 cm  LEFT ATRIUM           Index       RIGHT ATRIUM          Index LA diam:      2.20 cm 1.75 cm/m  RA Area:     9.50 cm LA Vol (A4C): 15.8 ml 12.54 ml/m RA Volume:   18.40 ml 14.61 ml/m  AORTIC VALVE LVOT Vmax:   105.00 cm/s LVOT Vmean:  74.400 cm/s LVOT VTI:    0.196 m  AORTA Ao Root diam: 2.20 cm MITRAL VALVE MV Area (PHT): 3.99 cm     SHUNTS MV Decel Time: 190 msec     Systemic VTI:  0.20 m MV E velocity: 108.00 cm/s  Systemic Diam: 1.60 cm Lennie Odor MD Electronically signed by Lennie Odor MD Signature Date/Time: 04/25/2020/12:14:12 PM    Final     Labs:  CBC: Recent Labs    04/24/20 1144  WBC 8.5  HGB 9.3*  HCT 33.7*  PLT 461*    COAGS: Recent Labs    04/24/20 1144  INR 1.1  APTT 47*    BMP: Recent Labs    04/24/20 1144  NA 136  K 3.5  CL 103  CO2 24  GLUCOSE 120*  BUN 10  CALCIUM 9.2  CREATININE 0.72  GFRNONAA >60  GFRAA >60    LIVER FUNCTION TESTS: Recent Labs    04/24/20 1144  BILITOT <0.1*  AST 20  ALT 14  ALKPHOS 58  PROT 9.7*  ALBUMIN 4.4     Assessment and Plan:  ICH (large area of heterogeneous signal along the parasagittal right parietal lobe with surrounding edema and mild regional mass effect),  without known etiology. Plan for image-guided diagnostic cerebral arteriogram with Dr. Tommie Samsde  Macedo Rodrigues in IR tentatively for tomorrow 04/27/2020 pending IR scheduling. Patient will be NPO at midnight. Afebrile and WBCs WNL. She does not take blood thinners. INR 1.1 04/24/2020.  Risks and benefits of diagnostic cerebral angiogram were discussed with the patient including, but not limited to bleeding, infection, vascular injury, stroke, or contrast induced renal failure. This interventional procedure involves the use of X-rays and because of the nature of the planned procedure, it is possible that we will have prolonged use of X-ray fluoroscopy. Potential radiation risks to you include (but are not limited to) the following: - A slightly elevated risk for cancer  several years later in life. This risk is typically less than 0.5% percent. This risk is low in comparison to the normal incidence of human cancer, which is 33% for women and 50% for men according to the [employer] Society. - Radiation induced injury can include skin redness, resembling a rash, tissue breakdown / ulcers and hair loss (which can be temporary or permanent).  The likelihood of either of these occurring depends on the difficulty of the procedure and whether you are sensitive to radiation due to previous procedures, disease, or genetic conditions.  IF your procedure requires a prolonged use of radiation, you will be notified and given written instructions for further action.  It is your responsibility to monitor the irradiated area for the 2 weeks following the procedure and to notify your physician if you are concerned that you have suffered a radiation induced injury.   All of the patient's questions were answered. At this time, patient wishes to speak with her family regarding procedure prior to signing consent. Will set up procedure tentatively for tomorrow and will obtain consent in IR prior to procedure.   Thank you for this interesting consult.  I greatly enjoyed meeting Kathe Bectonuzelle Raburn and look forward to  participating in their care.  A copy of this report was sent to the requesting provider on this date.  Electronically Signed: Elwin MochaAlexandra Nithin Demeo, PA-C 04/26/2020, 10:35 AM   I spent a total of 40 Minutes in face to face in clinical consultation, greater than 50% of which was counseling/coordinating care for ICH/diagnostic cerebral arteriogram.

## 2020-04-27 ENCOUNTER — Inpatient Hospital Stay (HOSPITAL_COMMUNITY): Payer: BC Managed Care – PPO

## 2020-04-27 DIAGNOSIS — E611 Iron deficiency: Secondary | ICD-10-CM

## 2020-04-27 DIAGNOSIS — I611 Nontraumatic intracerebral hemorrhage in hemisphere, cortical: Principal | ICD-10-CM

## 2020-04-27 DIAGNOSIS — M329 Systemic lupus erythematosus, unspecified: Secondary | ICD-10-CM

## 2020-04-27 DIAGNOSIS — D509 Iron deficiency anemia, unspecified: Secondary | ICD-10-CM

## 2020-04-27 HISTORY — PX: IR US GUIDE VASC ACCESS RIGHT: IMG2390

## 2020-04-27 HISTORY — PX: IR ANGIO VERTEBRAL SEL VERTEBRAL UNI R MOD SED: IMG5368

## 2020-04-27 HISTORY — PX: IR ANGIO EXTERNAL CAROTID SEL EXT CAROTID UNI R MOD SED: IMG5371

## 2020-04-27 HISTORY — PX: IR ANGIO INTRA EXTRACRAN SEL INTERNAL CAROTID BILAT MOD SED: IMG5363

## 2020-04-27 LAB — ENA+DNA/DS+ANTICH+CENTRO+JO...
Anti JO-1: <0.2 AI (ref 0.0–0.9)
Centromere Ab Screen: <0.2 AI (ref 0.0–0.9)
Chromatin Ab SerPl-aCnc: 0.9 AI (ref 0.0–0.9)
ENA SM Ab Ser-aCnc: <0.2 AI (ref 0.0–0.9)
Ribonucleic Protein: 0.3 AI (ref 0.0–0.9)
SSA (Ro) (ENA) Antibody, IgG: >8 AI — ABNORMAL HIGH (ref 0.0–0.9)
SSB (La) (ENA) Antibody, IgG: 0.2 AI (ref 0.0–0.9)
Scleroderma (Scl-70) (ENA) Antibody, IgG: <0.2 AI (ref 0.0–0.9)
ds DNA Ab: 5 IU/mL (ref 0–9)

## 2020-04-27 LAB — ANA W/REFLEX IF POSITIVE: Anti Nuclear Antibody (ANA): POSITIVE — AB

## 2020-04-27 LAB — IRON AND TIBC
Iron: 33 ug/dL (ref 28–170)
Saturation Ratios: 8 % — ABNORMAL LOW (ref 10.4–31.8)
TIBC: 403 ug/dL (ref 250–450)
UIBC: 370 ug/dL

## 2020-04-27 LAB — FERRITIN: Ferritin: 5 ng/mL — ABNORMAL LOW (ref 11–307)

## 2020-04-27 MED ORDER — MIDAZOLAM HCL 2 MG/2ML IJ SOLN
INTRAMUSCULAR | Status: AC
  Filled 2020-04-27: qty 2

## 2020-04-27 MED ORDER — VERAPAMIL HCL 2.5 MG/ML IV SOLN
INTRAVENOUS | Status: AC
  Administered 2020-04-27: 5 mg
  Filled 2020-04-27: qty 2

## 2020-04-27 MED ORDER — MORPHINE SULFATE (PF) 2 MG/ML IV SOLN: 1.0000 mg | INTRAVENOUS | Status: DC | PRN

## 2020-04-27 MED ORDER — HEPARIN SODIUM (PORCINE) 1000 UNIT/ML IJ SOLN
INTRAMUSCULAR | Status: AC
  Filled 2020-04-27: qty 1

## 2020-04-27 MED ORDER — KETOROLAC TROMETHAMINE 15 MG/ML IJ SOLN
15.0000 mg | Freq: Four times a day (QID) | INTRAMUSCULAR | Status: DC | PRN
  Administered 2020-04-27: 15 mg via INTRAVENOUS
  Filled 2020-04-27: qty 1

## 2020-04-27 MED ORDER — LIDOCAINE HCL 1 % IJ SOLN
INTRAMUSCULAR | Status: AC | PRN
  Administered 2020-04-27: 5 mL

## 2020-04-27 MED ORDER — LIDOCAINE HCL 1 % IJ SOLN
INTRAMUSCULAR | Status: AC
  Filled 2020-04-27: qty 20

## 2020-04-27 MED ORDER — MIDAZOLAM HCL 2 MG/2ML IJ SOLN
INTRAMUSCULAR | Status: AC | PRN
  Administered 2020-04-27: 1 mg via INTRAVENOUS

## 2020-04-27 MED ORDER — HEPARIN SODIUM (PORCINE) 1000 UNIT/ML IJ SOLN
INTRAMUSCULAR | Status: AC | PRN
  Administered 2020-04-27: 5000 [IU] via INTRA_ARTERIAL

## 2020-04-27 MED ORDER — NITROGLYCERIN 1 MG/10 ML FOR IR/CATH LAB
INTRA_ARTERIAL | Status: AC
  Filled 2020-04-27: qty 10

## 2020-04-27 MED ORDER — SODIUM CHLORIDE 0.9 % IV SOLN
510.0000 mg | Freq: Once | INTRAVENOUS | Status: AC
  Administered 2020-04-27: 510 mg via INTRAVENOUS
  Filled 2020-04-27: qty 17

## 2020-04-27 MED ORDER — NITROGLYCERIN 1 MG/10 ML FOR IR/CATH LAB
INTRA_ARTERIAL | Status: AC | PRN
  Administered 2020-04-27: 300 ug via INTRA_ARTERIAL
  Administered 2020-04-27: 200 ug via INTRA_ARTERIAL

## 2020-04-27 MED ORDER — IOHEXOL 240 MG/ML SOLN
INTRAMUSCULAR | Status: AC
  Filled 2020-04-27: qty 100

## 2020-04-27 MED ORDER — IOHEXOL 240 MG/ML SOLN: 150.0000 mL | Freq: Once | INTRAMUSCULAR | Status: DC | PRN

## 2020-04-27 MED ORDER — FENTANYL CITRATE (PF) 100 MCG/2ML IJ SOLN
INTRAMUSCULAR | Status: AC
  Filled 2020-04-27: qty 2

## 2020-04-27 MED ORDER — FENTANYL CITRATE (PF) 100 MCG/2ML IJ SOLN
INTRAMUSCULAR | Status: AC | PRN
  Administered 2020-04-27: 25 ug via INTRAVENOUS

## 2020-04-27 NOTE — Progress Notes (Signed)
PT Cancellation Note  Patient Details Name: Rebecca Bright MRN: [id number] DOB: June 18, 1996   Cancelled Treatment:    Reason Eval/Treat Not Completed: Patient at procedure or test/unavailable. Pt currently off unit for procedure. Will check back as able to continue with PT POC.   Marylynn Pearson 04/27/2020, 8:41 AM   Conni Slipper, PT, DPT Acute Rehabilitation Services Pager: [phone] Office: [phone]

## 2020-04-27 NOTE — TOC Transition Note (Signed)
Transition of Care [facility]) - CM/SW Discharge Note   Patient Details  Name: Rebecca Bright MRN: [id number] Date of Birth: 1996-06-26  Transition of Care [facility]) CM/SW Contact:  Kermit Balo, RN Phone Number: 04/27/2020, 2:58 PM   Clinical Narrative:    Pt discharging home with outpatient therapy. Pt provided choice and [facility] selected. Orders in epic and information on the AVS.  Pt denies issues with home medications or transportation.  Pt has transport home when medically ready.   Final next level of care: OP Rehab Barriers to Discharge: No Barriers Identified   Patient Goals and CMS Choice     Choice offered to / list presented to : Patient  Discharge Placement                       Discharge Plan and Services                                     Social Determinants of Health (SDOH) Interventions     Readmission Risk Interventions No flowsheet data found.

## 2020-04-27 NOTE — Procedures (Signed)
INTERVENTIONAL NEURORADIOLOGY BRIEF POSTPROCEDURE NOTE  DIAGNOSTIC CEREBRAL ANGIOGRAM  Attending: Dr. Baldemar Lenis  Assistant: None  Diagnosis: ICH  Access site: Distal right radial artery  Access closure: TR band  Anesthesia: Moderate sedation  Medication used: 1 mg Versed IV; 25 mcg Fentanyl IV.  Complications: None.  Estimated blood loss: None.  Specimen: None.  Findings: No evidence of an AVM, d-AVF or other vascular finding to explain patient's ICH.  The patient tolerated the procedure well without incident or complication and is in stable condition.

## 2020-04-27 NOTE — Progress Notes (Signed)
STROKE TEAM PROGRESS NOTE   INTERVAL HISTORY She states she still has some left leg numbness which is now completely improved.  She had diagnostic cerebral catheter angiogram this morning which showed no evidence of aneurysm, AVM or venous sinus thrombosis.  After discussion with Dr. Ouida Sills hemorrhagic tumor remains a possibility.  I have consulted Dr. Dutch Quint from neurosurgery to get his opinion for possible biopsy  OBJECTIVE Vitals:   04/27/20 0955 04/27/20 1000 04/27/20 1005 04/27/20 1010  BP: 107/70 108/74 112/73 120/90  Pulse: (!) 102 93 92 89  Resp: 16 15 15 15   Temp:      TempSrc:      SpO2: 100% 100% 100% 100%  Weight:      Height:        CBC:  Recent Labs  Lab 04/24/20 1144 04/26/20 0952  WBC 8.5 5.2  NEUTROABS 6.1 2.9  HGB 9.3* 8.8*  HCT 33.7* 32.4*  MCV 65.1* 64.3*  PLT 461* 404*    Basic Metabolic Panel:  Recent Labs  Lab 04/24/20 1144  NA 136  K 3.5  CL 103  CO2 24  GLUCOSE 120*  BUN 10  CREATININE 0.72  CALCIUM 9.2    Lipid Panel: No results found for: CHOL, TRIG, HDL, CHOLHDL, VLDL, LDLCALC HgbA1c: No results found for: HGBA1C Urine Drug Screen:     Component Value Date/Time   LABOPIA NONE DETECTED 04/24/2020 1130   COCAINSCRNUR NONE DETECTED 04/24/2020 1130   LABBENZ NONE DETECTED 04/24/2020 1130   AMPHETMU NONE DETECTED 04/24/2020 1130   THCU NONE DETECTED 04/24/2020 1130   LABBARB NONE DETECTED 04/24/2020 1130    Alcohol Level     Component Value Date/Time   ETH <10 04/24/2020 1143    IMAGING  CT Head Wo Contrast 04/24/2020 IMPRESSION:  There is masslike hypodensity within the RIGHT frontoparietal vertex. There is central punctate hyperdensity within this white matter hypodensity. This spans approximately 4 cm. Findings are concerning for a mass with associated hemorrhage. Infarction is felt less likely but venous infarction is on the differential. Recommend further evaluation with contrast-enhanced MRI of the brain.     MR Brain W and Wo Contrast MR ANGIO HEAD WO CONTRAST MR Angiogram Neck W or Wo Contrast 04/24/2020   ADDENDUM: Area of hemorrhage measures approximately 5.5 x 3.1 x 5.2 cm on SWI. There are a few small areas of intrinsic T1 shortening reflecting subacute blood products. However, the majority of the hemorrhage appears to be acute despite lack of significant hyperdensity on CT. IMPRESSION:  Large area of heterogeneous signal along the parasagittal right parietal lobe with surrounding edema and mild regional mass effect. Appearance is most consistent with a parenchymal hematoma without evidence of underlying lesion. Unremarkable vascular imaging.     MR MRV HEAD W WO CONTRAST 04/24/2020 IMPRESSION:  Normal appearance of the dural venous sinuses. Sequela of evolving right parietal intraparenchymal hematoma.    Transthoracic Echocardiogram  04/25/20 IMPRESSIONS  1. Left ventricular ejection fraction, by estimation, is 60 to 65%. The  left ventricle has normal function. The left ventricle has no regional  wall motion abnormalities. Left ventricular diastolic parameters were  normal.  2. Right ventricular systolic function is normal. The right ventricular  size is normal. Tricuspid regurgitation signal is inadequate for assessing  PA pressure.  3. The mitral valve is grossly normal. No evidence of mitral valve  regurgitation. No evidence of mitral stenosis.  4. The aortic valve is tricuspid. Aortic valve regurgitation is not  visualized.  No aortic stenosis is present.  5. The inferior vena cava is normal in size with greater than 50%  respiratory variability, suggesting right atrial pressure of 3 mmHg.    ECG - SR rate 99 BPM. Right atrial enlargement. (See cardiology reading for complete details)   PHYSICAL EXAM Blood pressure 120/90, pulse 89, temperature 98.3 F (36.8 C), temperature source Oral, resp. rate 15, height 4\' 10"  (1.473 m), weight 38.1 kg, last menstrual period  04/16/2020, SpO2 100 %. Frail petite young 06/16/2020 African lady not in distress. . Afebrile. Head is nontraumatic. Neck is supple without bruit.    Cardiac exam no murmur or gallop. Lungs are clear to auscultation. Distal pulses are well felt. Neurological Exam ;  Awake  Alert oriented x 3. Normal speech and language.eye movements full without nystagmus.fundi were not visualized. Vision acuity and fields appear normal. Hearing is normal. Palatal movements are normal. Face symmetric. Tongue midline. Normal strength, tone, reflexes and coordination. Normal sensation except mild subjective paresthesias in left leg.. Gait deferred.    ASSESSMENT/PLAN [name] is a 24 y.o. female with ITP and Lupus, on Promacta (a thrombopoietin-receptor agonist) and Plaquenil, who presented to the [facility] ED with a chief complaint of LUE and LLE numbness and weakness, somewhat confused with a 1 week history of mild throbbing headache and jaw pain, with radiation to the back of her neck. She did not receive IV t-PA due to ICH.   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  Resultant left-sided paresthesias and mild weakness  CT head - There is masslike hypodensity within the RIGHT frontoparietal vertex. There is central punctate hyperdensity within this white matter hypodensity. This spans approximately 4 cm. Findings are concerning for a mass with associated hemorrhage. Infarction is felt less likely but venous infarction is on the differential  MRI head - Large area of heterogeneous signal along the parasagittal right parietal lobe with surrounding edema and mild regional mass effect. Appearance is most consistent with a parenchymal hematoma without evidence of underlying lesion. Area of hemorrhage measures approximately 5.5 x 3.1 x 5.2 cm on SWI. There are a few small areas of intrinsic T1 shortening reflecting subacute blood products. However,  the majority of the hemorrhage appears to be acute despite lack of significant hyperdensity on CT.  MRA head and neck - unremarkable  MR MRV HEAD W WO CONTRAST - Normal appearance of the dural venous sinuses. Sequela of evolving right parietal intraparenchymal hematoma.   Cerebral angio Unremarkable    2D Echo - EF 60 - 65%. No cardiac source of emboli identified.   [facility] Virus 2 - negative  UDS - negative  No antithrombotic prior to admission, now on No antithrombotic given hemorrhage   Therapy recommendations:  Outpt PT   Disposition:  Return home    [facility] for d/c once angio bedrest completed and stable post procedure  Follow up neuro in 4 weeks. Order placed  Hyperlipidemia  Home Lipid lowering medication: none   LDL - not ordered  Given non-atherosclerotic infarct, will not treat w/ statin  ITP   [facility]  Diagnosed in 2016    Decadron 4 daily [held because platelets were good and 788 range]continues on Promacta (eltromopag)  In remission for over 6 months  Lupus   Followed by Dr. 2017 of rheumatology  Hydroxychloroquine (Palquinel)  Neurosurgery Consult - Dr Zenovia Jordan 8/13  Not convinced that this is entirely hematoma but did  agree there may be some microhemorrhages.  Felt this was more than likely either an ischemic event with her history of ITP and lupus or the could be something some sort of mass underlying.   No indication for surgery at this point - recommend Medical Neurology evaluate and workup   Probably patient will need a repeat MRI scan 2-3 weeks may consider MRV.  Neurosurgery consult for bx consideration for underlying etiology (Pool) 8/16 - consulted by Pearlean Brownie - feels post vasculitic infarct with no evidence of neoplasm. Continue immunosuppression for lupus. Discuss steroids further w/ neuro & rheumatologist. F/u MRI in 2 mos - if progressive worsening can do sooner.   Other Stroke Risk  Factors  ITP  Lupus  Contraceptive (Norethindrone)  Other Active Problems  Anemia - Hgb - 9.3  Sed rate - 62  ;  CRP - 11.2  Hospital day # 3   Patient etiology of this large hemorrhagic right parietal mass remains indeterminate at this time.  Patient seems to be doing clinically fine so I agree with not rushing into brain biopsy unless the situation changes.  I also discussed case with Dr. Barbaraann Cao neuro oncologist who will see the patient as outpatient consultation after discharge.  If she starts having headaches or neurological worsening may need to consider adding Decadron.  Long discussion with patient and with Dr. Ella Jubilee and answered questions.  Discussed with Dr. Dutch Quint and Dr. Ouida Sills as well.  Greater than 50% time during this 35-minute visit was spent on counseling and coordination of care and discussion with other specialists and planning treatment Delia Heady, MD To contact Stroke Continuity provider, please refer to [email or web address]. After hours, contact General Neurology

## 2020-04-27 NOTE — Progress Notes (Addendum)
PROGRESS NOTE    Rebecca Bright  YHC:[id number] DOB: [birth year] DOA: 04/24/2020 PCP: Rebecca Mounts, MD    Brief Narrative:  Patient admitted to the hospital with a working diagnosis of medium sized right parietal frontal intracerebral hemorrhage with surrounding edema.  24 year old female with significant past medical history for ITP and systemic lupus erythematous.  She reported left upper extremity and left lower extremity numbness and weakness for 24 hours prior to hospitalization.  Her symptoms were of gradual onset, associated with difficulty ambulating and mild confusion.  On her initial physical examination blood pressure 132/91, heart rate 108, temperature 99.4, respiratory 21, oxygen saturation 100%.  Her lungs were clear to auscultation bilaterally, heart S1-S2, present rhythmic, abdomen soft, no lower extremity edema, no rashes. Sodium 136, potassium 3.5, chloride 103, bicarb 24, glucose 120, BUN 10, creatinine 0.72, white count 8.5, hemoglobin 9.3, hematocrit 33.7, platelets 461.  SARS COVID-19 negative.  Urinalysis negative for infection.  Drug screen negative. CT with right frontoparietal hypodensity, 4 cm, likely hemorrhagic. Brain MRI with parenchymal hematoma along the parasagittal right parietal lobe with surrounding edema and mild regional mass-effect. EKG 99 bpm, normal axis, right bundle branch block, normal intervals, sinus rhythm, no ST segment or T wave changes.  Patient was admitted to the intensive care unit for close neurologic monitoring.  Neurosurgery was consulted, no indication for surgical intervention. Scheduled to have cerebral catheter angiogram tomorrow rule out underlying vascular malformation.    Assessment & Plan:   Principal Problem:   ICH (intracerebral hemorrhage) (HCC) Active Problems:   Intracranial edema (HCC)   Intraparenchymal hematoma of brain (HCC)   Lupus (HCC)   Iron deficiency   1. New parenchymal hematoma at the right  parietal lobe with local edema and mild regional mass effect. Sp cerebral angiography, positive headache, but no nausea or vomiting.  Angiography with no evidence of an AVM, d-AVF or other vascular changes.No indication for surgical intervention per neurosurgery recommendations. No clinical signs of neoplasm.   Will contin lupus treatment, possible post vasculitic infarct with secondary hemorrhage.  Continue with GI prophylaxis.   2. ITP/ LUPUS systemic erythematous. No active signs of acute flare. On hydroxocholoquine with good toleration.   Stable renal function. Negative protein.   3. HTN. Her blood pressure has remained well controlled. 116/79 and 113/89, off antihypertensive medications.   4. Dyslipidemia. Follow as outpatient   5. Iron deficiency anemia/ ITP. Iron 33, with transferrin saturation 8, ferritin of 5  And tibc 403.  Will add one dose of IV iron and will need outpatient follow up.   Continue with promacta.   Status is: Inpatient  Remains inpatient appropriate because:Inpatient level of care appropriate due to severity of illness   Dispo: The patient is from: Home              Anticipated d/c is to: Home              Anticipated d/c date is: 1 day              Patient currently is not medically stable to d/c.   DVT prophylaxis: scd   Code Status:   Bright  Family Communication:  No family at the bedside       Consultants:   Neurology   IR  Neurosurgery   Procedures:   Cerebral angiography       Subjective: Patient post procedure with moderate to severe headache, no nausea or vomiting. She would like to avoid opiate analgesics.  No chest pain or dyspnea.   Objective: Vitals:   04/26/20 2038 04/27/20 0043 04/27/20 0424 04/27/20 0754  BP: 104/74 (!) 89/61 110/77 106/79  Pulse: 87 87 80 84  Resp:   16 18  Temp: 98.7 F (37.1 C) 98.5 F (36.9 C) 98.4 F (36.9 C) 98.3 F (36.8 C)  TempSrc: Oral Oral Oral Oral  SpO2: 100% 100% 100% 100%    Weight:      Height:       No intake or output data in the 24 hours ending 04/27/20 0828 Filed Weights   04/24/20 1011  Weight: 38.1 kg    Examination:   General: Not in pain or dyspnea. Neurology: Awake and alert, non focal  E ENT: no pallor, no icterus, oral mucosa moist Cardiovascular: No JVD. S1-S2 present, rhythmic, no gallops, rubs, or murmurs. No lower extremity edema. Pulmonary: positive breath sounds bilaterally Gastrointestinal. Abdomen soft and non tender Skin. No rashes Musculoskeletal: no joint deformities     Data Reviewed: I have personally reviewed following labs and imaging studies  CBC: Recent Labs  Lab 04/24/20 1144 04/26/20 0952  WBC 8.5 5.2  NEUTROABS 6.1 2.9  HGB 9.3* 8.8*  HCT 33.7* 32.4*  MCV 65.1* 64.3*  PLT 461* 404*   Basic Metabolic Panel: Recent Labs  Lab 04/24/20 1144  NA 136  K 3.5  CL 103  CO2 24  GLUCOSE 120*  BUN 10  CREATININE 0.72  CALCIUM 9.2   GFR: Estimated Creatinine Clearance: 65.2 mL/min (by C-G formula based on SCr of 0.72 mg/dL). Liver Function Tests: Recent Labs  Lab 04/24/20 1144  AST 20  ALT 14  ALKPHOS 58  BILITOT <0.1*  PROT 9.7*  ALBUMIN 4.4   No results for input(s): LIPASE, AMYLASE in the last 168 hours. No results for input(s): AMMONIA in the last 168 hours. Coagulation Profile: Recent Labs  Lab 04/24/20 1144  INR 1.1   Cardiac Enzymes: No results for input(s): CKTOTAL, CKMB, CKMBINDEX, TROPONINI in the last 168 hours. BNP (last 3 results) No results for input(s): PROBNP in the last 8760 hours. HbA1C: No results for input(s): HGBA1C in the last 72 hours. CBG: Recent Labs  Lab 04/24/20 1154  GLUCAP 106*   Lipid Profile: No results for input(s): CHOL, HDL, LDLCALC, TRIG, CHOLHDL, LDLDIRECT in the last 72 hours. Thyroid Function Tests: No results for input(s): TSH, T4TOTAL, FREET4, T3FREE, THYROIDAB in the last 72 hours. Anemia Panel: No results for input(s): VITAMINB12, FOLATE,  FERRITIN, TIBC, IRON, RETICCTPCT in the last 72 hours.    Radiology Studies: I have reviewed all of the imaging during this hospital visit personally     Scheduled Meds: .  stroke: mapping our early stages of recovery book   Does not apply Once  . Chlorhexidine Gluconate Cloth  6 each Topical Daily  . eltrombopag  50 mg Oral Daily  . hydroxychloroquine  200 mg Oral Daily  . norethindrone  1 tablet Oral Daily  . pantoprazole  40 mg Oral QHS  . senna-docusate  1 tablet Oral BID   Continuous Infusions:   LOS: 3 days        Tallie Dodds Annett Gula, MD

## 2020-04-27 NOTE — Evaluation (Signed)
Occupational Therapy Evaluation Patient Details Name: Rebecca Bright MRN: [id number] DOB: 1996/08/18 Today's Date: 04/27/2020    History of Present Illness 24 y.o. female with ITP and Lupus, on Promacta (a thrombopoietin-receptor agonist) and Plaquenil, who presented to the [facility] ED with a chief complaint of LUE and LLE numbness and weakness starting at 1800 on Thursday. The symptoms were gradual in onset and were associated with difficulty ambulating. She also was somewhat confused and walked to the wrong apartment at some point. CT head at the [facility] ED showed a medium-sized mass-like lesion in the right parietal lobe with a large amount of surrounding edema, concerning for a subacute hemorrhage.    Clinical Impression   PTA patient independent and working. Admitted for above and limited by problem list below, including L sided weakness and mild dysmetria, impaired balance.  Patient currently requires min guard for dynamic balance and mobility tasks, min guard for LB ADLs and supervision for seated UB ADLs.  Educated on safety and recommendations to optimize independence and safety.  Believe she will benefit from further OT services while admitted for LUE strength/coordiantion, ADL independence, but anticipate no further needs after dc home.     Follow Up Recommendations  No OT follow up;Supervision - Intermittent    Equipment Recommendations  None recommended by OT    Recommendations for Other Services       Precautions / Restrictions Precautions Precautions: Fall Restrictions Weight Bearing Restrictions: No      Mobility Bed Mobility Overal bed mobility: Modified Independent                Transfers Overall transfer level: Needs assistance Equipment used: None Transfers: Sit to/from Stand Sit to Stand: Min guard         General transfer comment: min guard for initial balance and safety     Balance Overall balance assessment: Needs assistance Sitting-balance support:  No upper extremity supported;Feet supported Sitting balance-Leahy Scale: Good     Standing balance support: No upper extremity supported;During functional activity Standing balance-Leahy Scale: Fair Standing balance comment: min guard to min assist for dynamic balance                           ADL either performed or assessed with clinical judgement   ADL Overall ADL's : Needs assistance/impaired     Grooming: Min guard;Standing   Upper Body Bathing: Set up;Sitting   Lower Body Bathing: Min guard;Sit to/from stand   Upper Body Dressing : Set up;Sitting   Lower Body Dressing: Min guard;Sit to/from stand Lower Body Dressing Details (indicate cue type and reason): donning socks in bed, min guard sit to stand  Toilet Transfer: Min guard;Ambulation           Functional mobility during ADLs: Min guard General ADL Comments: pt limited by L sided coordination, impaired balance      Vision Baseline Vision/History: No visual deficits Patient Visual Report: No change from baseline Vision Assessment?: No apparent visual deficits     Perception     Praxis      Pertinent Vitals/Pain Pain Assessment: 0-10 Pain Score: 7  Pain Location: head Pain Descriptors / Indicators: Headache Pain Intervention(s): Limited activity within patient's tolerance;Monitored during session;Repositioned     Hand Dominance Right   Extremity/Trunk Assessment Upper Extremity Assessment Upper Extremity Assessment: LUE deficits/detail LUE Deficits / Details: grossly 4/5 with mild dysmetria (compared to 5/5 R UE)  LUE Sensation: WNL LUE Coordination: decreased gross  motor   Lower Extremity Assessment Lower Extremity Assessment: Defer to PT evaluation   Cervical / Trunk Assessment Cervical / Trunk Assessment: Normal   Communication Communication Communication: No difficulties   Cognition Arousal/Alertness: Awake/alert Behavior During Therapy: WFL for tasks  assessed/performed Overall Cognitive Status: Within Functional Limits for tasks assessed                                     General Comments  VSS on RA     Exercises     Shoulder Instructions      Home Living Family/patient expects to be discharged to:: Private residence Living Arrangements: Alone Available Help at Discharge: Family;Available 24 hours/day Type of Home: Apartment Home Access: Stairs to enter [employer] of Steps: flight Entrance Stairs-Rails: Can reach both Home Layout: One level     Bathroom Shower/Tub: Chief Strategy Officer: Standard     Home Equipment: Grab bars - tub/shower          Prior Functioning/Environment Level of Independence: Independent        Comments: 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 [employer]        OT Problem List: Decreased strength;Decreased activity tolerance;Impaired balance (sitting and/or standing);Pain;Impaired UE functional use;Decreased coordination      OT Treatment/Interventions: Self-care/ADL training;Neuromuscular education;DME and/or AE instruction;Patient/family education;Balance training;Therapeutic activities    OT Goals(Current goals can be found in the care plan section) Acute Rehab OT Goals Patient Stated Goal: To return to independent  OT Goal Formulation: With patient Time For Goal Achievement: 05/11/20 Potential to Achieve Goals: Good  OT Frequency: Min 2X/week   Barriers to D/C:            Co-evaluation              AM-PAC OT "6 Clicks" Daily Activity     Outcome Measure Help from another person eating meals?: None Help from another person taking care of personal grooming?: A Little Help from another person toileting, which includes using toliet, bedpan, or urinal?: A Little Help from another person bathing (including washing, rinsing, drying)?: A Little Help from another person to put on and taking off regular upper body  clothing?: A Little Help from another person to put on and taking off regular lower body clothing?: A Little 6 Click Score: 19   End of Session Equipment Utilized During Treatment: Gait belt Nurse Communication: Mobility status;Patient requests pain meds  Activity Tolerance: Patient tolerated treatment well Patient left: with call bell/phone within reach;with bed alarm set;Other (comment);with family/visitor present (seated EOB )  OT Visit Diagnosis: Other abnormalities of gait and mobility (R26.89);Muscle weakness (generalized) (M62.81);Pain Pain - part of body:  (headache )                Time: 4585-9292 OT Time Calculation (min): 15 min Charges:  OT General Charges $OT Visit: 1 Visit OT Evaluation $OT Eval Low Complexity: 1 Low  Barry Brunner, OT Acute Rehabilitation Services Pager [phone] Office [phone]   Chancy Milroy 04/27/2020, 3:29 PM

## 2020-04-27 NOTE — Sedation Documentation (Signed)
Right radial sheath removed,11cc of air applied to TR band @ 1011

## 2020-04-27 NOTE — Progress Notes (Addendum)
HEMATOLOGY-ONCOLOGY PROGRESS NOTE  SUBJECTIVE: Ms. Fullen presented to the emergency room secondary to left arm and leg numbness and heaviness.  She reported that she had some mild confusion and was walking into walls.  CT of the head on admission showed a masslike hypodensity within the right frontoparietal vertex which was concerning for mass with associated hemorrhage.  MRI/MRA brain showed a large area of heterogeneous signal along the parasagittal right parietal lobe with surrounding edema and mild regional mass-effect with appearance most consistent with parenchymal hematoma without evidence of underlying lesion.  She underwent diagnostic cerebral angiogram earlier today which showed no evidence of AVM, dural AV fistula, or other vascular abnormality. She reports that she currently has a headache which has improved slightly since admission.  She recently took Tylenol.  She has not noted any bleeding.  REVIEW OF SYSTEMS:   Constitutional: Denies fevers, chills Eyes: Still having some visual spatial disorientation Respiratory: Denies cough, dyspnea or wheezes Cardiovascular: Denies palpitation, chest discomfort Gastrointestinal:  Denies nausea, heartburn or change in bowel habits Skin: Denies abnormal skin rashes Lymphatics: Denies new lymphadenopathy or easy bruising Neurological: Reports headache that has improved since admission, left-sided paresthesias improving Behavioral/Psych: Mood is stable, no new changes  Extremities: No lower extremity edema All other systems were reviewed with the patient and are negative.  I have reviewed the past medical history, past surgical history, social history and family history with the patient and they are unchanged from previous note.   PHYSICAL EXAMINATION: ECOG PERFORMANCE STATUS: 1 - Symptomatic but completely ambulatory  Vitals:   04/27/20 1129 04/27/20 1149  BP: 116/79 113/89  Pulse: 84   Resp: 12 17  Temp:    SpO2: 100% 100%   Filed  Weights   04/24/20 1011  Weight: 38.1 kg    Intake/Output from previous day: No intake/output data recorded.  GENERAL:alert, no distress and comfortable SKIN: skin color, texture, turgor are normal, no rashes or significant lesions EYES: normal, Conjunctiva are pink and non-injected, sclera clear NEURO: alert & oriented x 3 with fluent speech, no focal motor/sensory deficits  LABORATORY DATA:  I have reviewed the data as listed CMP Latest Ref Rng & Units 04/24/2020  Glucose 70 - 99 mg/dL 120(H)  BUN 6 - 20 mg/dL 10  Creatinine 0.44 - 1.00 mg/dL 0.72  Sodium 135 - 145 mmol/L 136  Potassium 3.5 - 5.1 mmol/L 3.5  Chloride 98 - 111 mmol/L 103  CO2 22 - 32 mmol/L 24  Calcium 8.9 - 10.3 mg/dL 9.2  Total Protein 6.5 - 8.1 g/dL 9.7(H)  Total Bilirubin 0.3 - 1.2 mg/dL <0.1(L)  Alkaline Phos 38 - 126 U/L 58  AST 15 - 41 U/L 20  ALT 0 - 44 U/L 14    Lab Results  Component Value Date   WBC 5.2 04/26/2020   HGB 8.8 (L) 04/26/2020   HCT 32.4 (L) 04/26/2020   MCV 64.3 (L) 04/26/2020   PLT 404 (H) 04/26/2020   NEUTROABS 2.9 04/26/2020    CT Head Wo Contrast  Result Date: 04/24/2020 CLINICAL DATA:  Numbness and heaviness of the LEFT leg EXAM: CT HEAD WITHOUT CONTRAST TECHNIQUE: Contiguous axial images were obtained from the base of the skull through the vertex without intravenous contrast. COMPARISON:  None. FINDINGS: Brain: There is masslike hypodensity within the RIGHT frontoparietal vertex. There is central punctate hyperdensity within this white matter hypodensity. This spans approximately 4 cm. Vascular: No hyperdense vessel or unexpected calcification. Skull: Normal. Negative for fracture or focal lesion.  Sinuses/Orbits: No acute finding. Other: None. IMPRESSION: There is masslike hypodensity within the RIGHT frontoparietal vertex. There is central punctate hyperdensity within this white matter hypodensity. This spans approximately 4 cm. Findings are concerning for a mass with  associated hemorrhage. Infarction is felt less likely but venous infarction is on the differential. Recommend further evaluation with contrast-enhanced MRI of the brain. These results were called by telephone at the time of interpretation on 04/24/2020 at 12:16 pm to provider [facility] , who verbally acknowledged these results. Electronically Signed   By: Valentino Saxon MD   On: 04/24/2020 12:20   MR ANGIO HEAD WO CONTRAST  Addendum Date: 04/24/2020   ADDENDUM REPORT: 04/24/2020 16:55 ADDENDUM: Area of hemorrhage measures approximately 5.5 x 3.1 x 5.2 cm on SWI. There are a few small areas of intrinsic T1 shortening reflecting subacute blood products. However, the majority of the hemorrhage appears to be acute despite lack of significant hyperdensity on CT. Electronically Signed   By: Macy Mis M.D.   On: 04/24/2020 16:55   Result Date: 04/24/2020 CLINICAL DATA:  Left-sided weakness and numbness with abnormal CT, per EMR history of ITP secondary to lupus EXAM: MRI HEAD WITHOUT AND WITH CONTRAST MRA HEAD WITHOUT CONTRAST MRA NECK WITHOUT AND WITH CONTRAST TECHNIQUE: Multiplanar, multiecho pulse sequences of the brain and surrounding structures were obtained without and with intravenous contrast. Angiographic images of the Circle of Willis were obtained using MRA technique without intravenous contrast. Angiographic images of the neck were obtained using MRA technique without and with intravenous contrast. Carotid stenosis measurements (when applicable) are obtained utilizing NASCET criteria, using the distal internal carotid diameter as the denominator. CONTRAST:  73m GADAVIST GADOBUTROL 1 MMOL/ML IV SOLN COMPARISON:  Correlation made with same day CT FINDINGS: MRI HEAD Brain: Heterogeneous signal is identified along the parasagittal right parietal lobe with moderate surrounding edema. There is marked corresponding susceptibility reflecting blood products. Regional mass effect is present. There is no  evidence of intraventricular extension. There is no abnormal enhancement. A punctate focus of susceptibility along the right caudate likely reflects a chronic microhemorrhage. Vascular: Major vessel flow voids at the skull base are preserved. Skull and upper cervical spine: Normal marrow signal is preserved. Sinuses/Orbits: Paranasal sinuses are aerated. Orbits are unremarkable. Other: Sella is unremarkable.  Mastoid air cells are clear. MRA HEAD Intracranial internal carotid arteries are patent. Middle and anterior cerebral arteries are patent. Intracranial vertebral arteries, basilar artery, posterior cerebral arteries are patent. There is no significant stenosis or aneurysm. MRA NECK Great vessel origins are patent. Common, internal, and external carotid arteries are patent. Extracranial vertebral arteries are patent. There is no measurable stenosis. IMPRESSION: Large area of heterogeneous signal along the parasagittal right parietal lobe with surrounding edema and mild regional mass effect. Appearance is most consistent with a parenchymal hematoma without evidence of underlying lesion. Unremarkable vascular imaging. Electronically Signed: By: PMacy MisM.D. On: 04/24/2020 15:25   MR Angiogram Neck W or Wo Contrast  Addendum Date: 04/24/2020   ADDENDUM REPORT: 04/24/2020 16:55 ADDENDUM: Area of hemorrhage measures approximately 5.5 x 3.1 x 5.2 cm on SWI. There are a few small areas of intrinsic T1 shortening reflecting subacute blood products. However, the majority of the hemorrhage appears to be acute despite lack of significant hyperdensity on CT. Electronically Signed   By: PMacy MisM.D.   On: 04/24/2020 16:55   Result Date: 04/24/2020 CLINICAL DATA:  Left-sided weakness and numbness with abnormal CT, per EMR history of ITP  secondary to lupus EXAM: MRI HEAD WITHOUT AND WITH CONTRAST MRA HEAD WITHOUT CONTRAST MRA NECK WITHOUT AND WITH CONTRAST TECHNIQUE: Multiplanar, multiecho pulse sequences  of the brain and surrounding structures were obtained without and with intravenous contrast. Angiographic images of the Circle of Willis were obtained using MRA technique without intravenous contrast. Angiographic images of the neck were obtained using MRA technique without and with intravenous contrast. Carotid stenosis measurements (when applicable) are obtained utilizing NASCET criteria, using the distal internal carotid diameter as the denominator. CONTRAST:  67m GADAVIST GADOBUTROL 1 MMOL/ML IV SOLN COMPARISON:  Correlation made with same day CT FINDINGS: MRI HEAD Brain: Heterogeneous signal is identified along the parasagittal right parietal lobe with moderate surrounding edema. There is marked corresponding susceptibility reflecting blood products. Regional mass effect is present. There is no evidence of intraventricular extension. There is no abnormal enhancement. A punctate focus of susceptibility along the right caudate likely reflects a chronic microhemorrhage. Vascular: Major vessel flow voids at the skull base are preserved. Skull and upper cervical spine: Normal marrow signal is preserved. Sinuses/Orbits: Paranasal sinuses are aerated. Orbits are unremarkable. Other: Sella is unremarkable.  Mastoid air cells are clear. MRA HEAD Intracranial internal carotid arteries are patent. Middle and anterior cerebral arteries are patent. Intracranial vertebral arteries, basilar artery, posterior cerebral arteries are patent. There is no significant stenosis or aneurysm. MRA NECK Great vessel origins are patent. Common, internal, and external carotid arteries are patent. Extracranial vertebral arteries are patent. There is no measurable stenosis. IMPRESSION: Large area of heterogeneous signal along the parasagittal right parietal lobe with surrounding edema and mild regional mass effect. Appearance is most consistent with a parenchymal hematoma without evidence of underlying lesion. Unremarkable vascular imaging.  Electronically Signed: By: PMacy MisM.D. On: 04/24/2020 15:25   MR Brain W and Wo Contrast  Addendum Date: 04/24/2020   ADDENDUM REPORT: 04/24/2020 16:55 ADDENDUM: Area of hemorrhage measures approximately 5.5 x 3.1 x 5.2 cm on SWI. There are a few small areas of intrinsic T1 shortening reflecting subacute blood products. However, the majority of the hemorrhage appears to be acute despite lack of significant hyperdensity on CT. Electronically Signed   By: PMacy MisM.D.   On: 04/24/2020 16:55   Result Date: 04/24/2020 CLINICAL DATA:  Left-sided weakness and numbness with abnormal CT, per EMR history of ITP secondary to lupus EXAM: MRI HEAD WITHOUT AND WITH CONTRAST MRA HEAD WITHOUT CONTRAST MRA NECK WITHOUT AND WITH CONTRAST TECHNIQUE: Multiplanar, multiecho pulse sequences of the brain and surrounding structures were obtained without and with intravenous contrast. Angiographic images of the Circle of Willis were obtained using MRA technique without intravenous contrast. Angiographic images of the neck were obtained using MRA technique without and with intravenous contrast. Carotid stenosis measurements (when applicable) are obtained utilizing NASCET criteria, using the distal internal carotid diameter as the denominator. CONTRAST:  412mGADAVIST GADOBUTROL 1 MMOL/ML IV SOLN COMPARISON:  Correlation made with same day CT FINDINGS: MRI HEAD Brain: Heterogeneous signal is identified along the parasagittal right parietal lobe with moderate surrounding edema. There is marked corresponding susceptibility reflecting blood products. Regional mass effect is present. There is no evidence of intraventricular extension. There is no abnormal enhancement. A punctate focus of susceptibility along the right caudate likely reflects a chronic microhemorrhage. Vascular: Major vessel flow voids at the skull base are preserved. Skull and upper cervical spine: Normal marrow signal is preserved. Sinuses/Orbits: Paranasal  sinuses are aerated. Orbits are unremarkable. Other: Sella is unremarkable.  Mastoid air  cells are clear. MRA HEAD Intracranial internal carotid arteries are patent. Middle and anterior cerebral arteries are patent. Intracranial vertebral arteries, basilar artery, posterior cerebral arteries are patent. There is no significant stenosis or aneurysm. MRA NECK Great vessel origins are patent. Common, internal, and external carotid arteries are patent. Extracranial vertebral arteries are patent. There is no measurable stenosis. IMPRESSION: Large area of heterogeneous signal along the parasagittal right parietal lobe with surrounding edema and mild regional mass effect. Appearance is most consistent with a parenchymal hematoma without evidence of underlying lesion. Unremarkable vascular imaging. Electronically Signed: By: Macy Mis M.D. On: 04/24/2020 15:25   IR US Guide Vasc Access Right  Result Date: 04/27/2020 INDICATION: 24 year old female with a past medical history significant for ITP and lupus. She presented to [facility] ED 04/24/2020 with complaints of left-sided numbness and headache. In ED, CT head revealed a medium-sized mass-like lesion in the right parietal lobe with a large amount of surrounding edema. MRI showed prominent susceptibility artifact associated with the area of edema, concerning for a subacute hemorrhage. She was transferred and admitted to [facility] for further management. She comes to our service to the right diagnostic cerebral angiogram to evaluate for possible vascular cause of hemorrhage. EXAM: ULTRASOUND-GUIDED VASCULAR ACCESS DIAGNOSTIC CEREBRAL ANGIOGRAM COMPARISON:  MR angiogram April 24, 2020 MEDICATIONS: No antibiotics administered. ANESTHESIA/SEDATION: Versed 1 mg IV; Fentanyl 25 mcg IV Moderate Sedation Time:  44 minutes The patient was continuously monitored during the procedure by the interventional radiology nurse under my direct supervision. FLUOROSCOPY TIME:  Fluoroscopy Time: 8  minutes 42 seconds (274.5 mGy). COMPLICATIONS: None immediate. TECHNIQUE: Informed written consent was obtained from the patient after a thorough discussion of the procedural risks, benefits and alternatives. All questions were addressed. Maximal Sterile Barrier Technique was utilized including caps, mask, sterile gowns, sterile gloves, sterile drape, hand hygiene and skin antiseptic. A timeout was performed prior to the initiation of the procedure. The right hand and wrist were prepped and draped in the usual sterile fashion. Real-time ultrasound guidance was utilized for vascular access including the acquisition of a permanent ultrasound image documenting patency of the accessed vessel. Using the modified Seldinger technique and a micropuncture kit, access was gained to the distal right radial artery at the anatomical snuffbox and a 5 French sheath was placed. Slow intra arterial infusion of 5,000 IU heparin, 5 mg Verapamil and 233 mcg nitroglicerin diluted in patient's own blood was performed. No significant fluctuation in patient's blood pressure seen. Then, a right radial artery roadmap was obtained via sheath side port. Normal brachial artery branching pattern seen. No significant anatomical variation. The right radial artery caliber is adequate for vascular access. Next, a 5 [place] Schurman 2 glide catheter was navigated over a 0.035" Terumo Glidewire into the right subclavian artery under fluoroscopic guidance. A subclavian artery roadmap was obtained. The catheter was then navigated under roadmap guidance into the right vertebral artery. A right vertebral artery angiogram with Townes and lateral views of the head were obtained. The catheter was then placed into the left common carotid artery. Frontal and lateral angiograms of the neck were obtained. The catheter was subsequently advanced into the left internal carotid artery under biplane roadmap. Frontal, lateral and magnified waters and Schller's view of  the head were obtained. The catheter was retracted into the right common carotid artery and then advanced under biplane roadmap into the right external carotid artery. Frontal and lateral angiograms of the head were obtained. The catheter was subsequently withdrawn. An inflatable  band was placed and inflated over the right hand access site. The vascular sheath was withdrawn and the band was slowly deflated until brisk flow was noted through the arteriotomy site. At this point, the band was reinflated with additional 2 cc of air to obtain patent hemostasis. FINDINGS: Right radial artery ultrasound and right radial artery angiogram: The caliber of the distal right radial artery is appropriate for angiogram access. The right radial artery and the right ulnar artery have normal course and caliber. No significant anatomical variants noted. Right vertebral artery angiograms: The right vertebral artery, basilar artery, and bilateral posterior cerebral arteries are unremarkable. The left vertebral artery seen by contrast reflux in appear unremarkable. Luminal caliber is smooth and tapering. No aneurysms or abnormally high-flow, early draining veins are seen. No regions of abnormal hypervascularity are noted. The visualized dural sinuses are patent. Left CCA angiograms: Cervical angiograms show normal course and caliber of the visualized left common carotid and internal carotid arteries. There are no significant stenoses. Left ICA angiograms: There is brisk vascular contrast filling of the left ACA and MCA vascular trees. The right ACA vascular tree is opacified via a prominent anterior communicating artery. Incidental note made of fenestration of the proximal left A2/ACA. Luminal caliber is smooth and tapering. No aneurysms or abnormally high-flow, early draining veins are seen. No regions of abnormal hypervascularity are noted. The visualized dural sinuses are patent. Right CCA angiograms: Cervical angiograms show normal  course and caliber of the visualized right common carotid and internal carotid arteries. There are no significant stenoses. Right ICA angiograms: There is brisk vascular contrast filling of the right ACA and MCA vascular trees. The left ACA vascular trees opacify via a prominent anterior communicating artery. Luminal caliber is smooth and tapering. No aneurysms or abnormally high-flow, early draining veins are seen. No regions of abnormal hypervascularity are noted. The visualized dural sinuses are patent. Right ECA and right occipital angiograms: No early venous drainage was noted. The cranial branches of the right external carotid artery are unremarkable. PROCEDURE: No intervention performed. IMPRESSION: Unremarkable diagnostic cerebral angiogram. Specifically, no evidence of an AVM, dural AV fistula or other vascular abnormality that could explain patient's known intraparenchymal hemorrhage. PLAN: Per Neurology. Electronically Signed   By: Pedro Earls M.D.   On: 04/27/2020 13:48   MR MRV HEAD W WO CONTRAST  Result Date: 04/24/2020 CLINICAL DATA:  Headache EXAM: MR VENOGRAM HEAD WITHOUT AND WITH CONTRAST TECHNIQUE: Angiographic images of the intracranial venous structures were obtained using MRV technique without and with intravenous contrast. CONTRAST:  68m GADAVIST GADOBUTROL 1 MMOL/ML IV SOLN COMPARISON:  04/24/2020 head CT.  04/24/2020 MRI/MRA head. FINDINGS: Normal appearance of the dural venous sinuses. No evidence of thrombosis or high-grade narrowing. No vascular malformation identified. No evidence of vascular injury involving the dural venous sinuses. Redemonstration of parasagittal intraparenchymal hematoma involving the right parietal region. IMPRESSION: Normal appearance of the dural venous sinuses. Sequela of evolving right parietal intraparenchymal hematoma. Electronically Signed   By: CPrimitivo GauzeM.D.   On: 04/24/2020 23:18   ECHOCARDIOGRAM COMPLETE  Result Date:  04/25/2020    ECHOCARDIOGRAM REPORT   Patient Name:   Rebecca SALONGADate of Exam: 04/25/2020 Medical Rec #:  [id number]      Height:       58.0 in Accession #:    [id number]     Weight:       84.0 lb Date of Birth:  [birth year]  BSA:          1.260 m Patient Age:    23 years        BP:           107/87 mmHg Patient Gender: F               HR:           81 bpm. Exam Location:  Inpatient Procedure: 2D Echo Indications:   stroke 434.91  History:       Patient has no prior history of Echocardiogram examinations.                Lupus.  Sonographer:   Jannett Celestine RDCS (AE) Referring      [place] Phys:  Sonographer Comments: petite stature made some images challenging IMPRESSIONS  1. Left ventricular ejection fraction, by estimation, is 60 to 65%. The left ventricle has normal function. The left ventricle has no regional wall motion abnormalities. Left ventricular diastolic parameters were normal.  2. Right ventricular systolic function is normal. The right ventricular size is normal. Tricuspid regurgitation signal is inadequate for assessing PA pressure.  3. The mitral valve is grossly normal. No evidence of mitral valve regurgitation. No evidence of mitral stenosis.  4. The aortic valve is tricuspid. Aortic valve regurgitation is not visualized. No aortic stenosis is present.  5. The inferior vena cava is normal in size with greater than 50% respiratory variability, suggesting right atrial pressure of 3 mmHg. Conclusion(s)/Recommendation(s): Normal biventricular function without evidence of hemodynamically significant valvular heart disease. No intracardiac source of embolism detected on this transthoracic study. A transesophageal echocardiogram is recommended to exclude cardiac source of embolism if clinically indicated. FINDINGS  Left Ventricle: Left ventricular ejection fraction, by estimation, is 60 to 65%. The left ventricle has normal function. The left ventricle has no regional wall motion  abnormalities. The left ventricular internal cavity size was normal in size. There is  no left ventricular hypertrophy. Left ventricular diastolic parameters were normal. Right Ventricle: The right ventricular size is normal. No increase in right ventricular wall thickness. Right ventricular systolic function is normal. Tricuspid regurgitation signal is inadequate for assessing PA pressure. Left Atrium: Left atrial size was normal in size. Right Atrium: Right atrial size was normal in size. Pericardium: Trivial pericardial effusion is present. Mitral Valve: The mitral valve is grossly normal. No evidence of mitral valve regurgitation. No evidence of mitral valve stenosis. Tricuspid Valve: The tricuspid valve is grossly normal. Tricuspid valve regurgitation is not demonstrated. No evidence of tricuspid stenosis. Aortic Valve: The aortic valve is tricuspid. Aortic valve regurgitation is not visualized. No aortic stenosis is present. Pulmonic Valve: The pulmonic valve was grossly normal. Pulmonic valve regurgitation is not visualized. No evidence of pulmonic stenosis. Aorta: The aortic root is normal in size and structure. Venous: The right upper pulmonary vein is normal. The inferior vena cava is normal in size with greater than 50% respiratory variability, suggesting right atrial pressure of 3 mmHg. IAS/Shunts: The atrial septum is grossly normal.  LEFT VENTRICLE PLAX 2D LVIDd:         3.40 cm  Diastology LVIDs:         2.60 cm  LV e' lateral:   11.30 cm/s LV PW:         0.60 cm  LV E/e' lateral: 9.6 LV IVS:        0.60 cm  LV e' medial:    13.70 cm/s LVOT diam:  1.60 cm  LV E/e' medial:  7.9 LV SV:         39 LV SV Index:   31 LVOT Area:     2.01 cm  LEFT ATRIUM           Index       RIGHT ATRIUM          Index LA diam:      2.20 cm 1.75 cm/m  RA Area:     9.50 cm LA Vol (A4C): 15.8 ml 12.54 ml/m RA Volume:   18.40 ml 14.61 ml/m  AORTIC VALVE LVOT Vmax:   105.00 cm/s LVOT Vmean:  74.400 cm/s LVOT VTI:     0.196 m  AORTA Ao Root diam: 2.20 cm MITRAL VALVE MV Area (PHT): 3.99 cm     SHUNTS MV Decel Time: 190 msec     Systemic VTI:  0.20 m MV E velocity: 108.00 cm/s  Systemic Diam: 1.60 cm Eleonore Chiquito MD Electronically signed by Eleonore Chiquito MD Signature Date/Time: 04/25/2020/12:14:12 PM    Final    IR ANGIO INTRA EXTRACRAN SEL INTERNAL CAROTID BILAT MOD SED  Result Date: 04/27/2020 INDICATION: 24 year old female with a past medical history significant for ITP and lupus. She presented to [facility] ED 04/24/2020 with complaints of left-sided numbness and headache. In ED, CT head revealed a medium-sized mass-like lesion in the right parietal lobe with a large amount of surrounding edema. MRI showed prominent susceptibility artifact associated with the area of edema, concerning for a subacute hemorrhage. She was transferred and admitted to [facility] for further management. She comes to our service to the right diagnostic cerebral angiogram to evaluate for possible vascular cause of hemorrhage. EXAM: ULTRASOUND-GUIDED VASCULAR ACCESS DIAGNOSTIC CEREBRAL ANGIOGRAM COMPARISON:  MR angiogram April 24, 2020 MEDICATIONS: No antibiotics administered. ANESTHESIA/SEDATION: Versed 1 mg IV; Fentanyl 25 mcg IV Moderate Sedation Time:  44 minutes The patient was continuously monitored during the procedure by the interventional radiology nurse under my direct supervision. FLUOROSCOPY TIME:  Fluoroscopy Time: 8 minutes 42 seconds (274.5 mGy). COMPLICATIONS: None immediate. TECHNIQUE: Informed written consent was obtained from the patient after a thorough discussion of the procedural risks, benefits and alternatives. All questions were addressed. Maximal Sterile Barrier Technique was utilized including caps, mask, sterile gowns, sterile gloves, sterile drape, hand hygiene and skin antiseptic. A timeout was performed prior to the initiation of the procedure. The right hand and wrist were prepped and draped in the usual sterile fashion.  Real-time ultrasound guidance was utilized for vascular access including the acquisition of a permanent ultrasound image documenting patency of the accessed vessel. Using the modified Seldinger technique and a micropuncture kit, access was gained to the distal right radial artery at the anatomical snuffbox and a 5 French sheath was placed. Slow intra arterial infusion of 5,000 IU heparin, 5 mg Verapamil and 161 mcg nitroglicerin diluted in patient's own blood was performed. No significant fluctuation in patient's blood pressure seen. Then, a right radial artery roadmap was obtained via sheath side port. Normal brachial artery branching pattern seen. No significant anatomical variation. The right radial artery caliber is adequate for vascular access. Next, a 5 [place] Vanderlinden 2 glide catheter was navigated over a 0.035" Terumo Glidewire into the right subclavian artery under fluoroscopic guidance. A subclavian artery roadmap was obtained. The catheter was then navigated under roadmap guidance into the right vertebral artery. A right vertebral artery angiogram with Townes and lateral views of the head were obtained. The catheter was then placed  into the left common carotid artery. Frontal and lateral angiograms of the neck were obtained. The catheter was subsequently advanced into the left internal carotid artery under biplane roadmap. Frontal, lateral and magnified waters and Schller's view of the head were obtained. The catheter was retracted into the right common carotid artery and then advanced under biplane roadmap into the right external carotid artery. Frontal and lateral angiograms of the head were obtained. The catheter was subsequently withdrawn. An inflatable band was placed and inflated over the right hand access site. The vascular sheath was withdrawn and the band was slowly deflated until brisk flow was noted through the arteriotomy site. At this point, the band was reinflated with additional 2 cc of air  to obtain patent hemostasis. FINDINGS: Right radial artery ultrasound and right radial artery angiogram: The caliber of the distal right radial artery is appropriate for angiogram access. The right radial artery and the right ulnar artery have normal course and caliber. No significant anatomical variants noted. Right vertebral artery angiograms: The right vertebral artery, basilar artery, and bilateral posterior cerebral arteries are unremarkable. The left vertebral artery seen by contrast reflux in appear unremarkable. Luminal caliber is smooth and tapering. No aneurysms or abnormally high-flow, early draining veins are seen. No regions of abnormal hypervascularity are noted. The visualized dural sinuses are patent. Left CCA angiograms: Cervical angiograms show normal course and caliber of the visualized left common carotid and internal carotid arteries. There are no significant stenoses. Left ICA angiograms: There is brisk vascular contrast filling of the left ACA and MCA vascular trees. The right ACA vascular tree is opacified via a prominent anterior communicating artery. Incidental note made of fenestration of the proximal left A2/ACA. Luminal caliber is smooth and tapering. No aneurysms or abnormally high-flow, early draining veins are seen. No regions of abnormal hypervascularity are noted. The visualized dural sinuses are patent. Right CCA angiograms: Cervical angiograms show normal course and caliber of the visualized right common carotid and internal carotid arteries. There are no significant stenoses. Right ICA angiograms: There is brisk vascular contrast filling of the right ACA and MCA vascular trees. The left ACA vascular trees opacify via a prominent anterior communicating artery. Luminal caliber is smooth and tapering. No aneurysms or abnormally high-flow, early draining veins are seen. No regions of abnormal hypervascularity are noted. The visualized dural sinuses are patent. Right ECA and right  occipital angiograms: No early venous drainage was noted. The cranial branches of the right external carotid artery are unremarkable. PROCEDURE: No intervention performed. IMPRESSION: Unremarkable diagnostic cerebral angiogram. Specifically, no evidence of an AVM, dural AV fistula or other vascular abnormality that could explain patient's known intraparenchymal hemorrhage. PLAN: Per Neurology. Electronically Signed   By: Pedro Earls M.D.   On: 04/27/2020 13:48   IR ANGIO VERTEBRAL SEL VERTEBRAL UNI R MOD SED  Result Date: 04/27/2020 INDICATION: 24 year old female with a past medical history significant for ITP and lupus. She presented to [facility] ED 04/24/2020 with complaints of left-sided numbness and headache. In ED, CT head revealed a medium-sized mass-like lesion in the right parietal lobe with a large amount of surrounding edema. MRI showed prominent susceptibility artifact associated with the area of edema, concerning for a subacute hemorrhage. She was transferred and admitted to [facility] for further management. She comes to our service to the right diagnostic cerebral angiogram to evaluate for possible vascular cause of hemorrhage. EXAM: ULTRASOUND-GUIDED VASCULAR ACCESS DIAGNOSTIC CEREBRAL ANGIOGRAM COMPARISON:  MR angiogram April 24, 2020 MEDICATIONS: No  antibiotics administered. ANESTHESIA/SEDATION: Versed 1 mg IV; Fentanyl 25 mcg IV Moderate Sedation Time:  44 minutes The patient was continuously monitored during the procedure by the interventional radiology nurse under my direct supervision. FLUOROSCOPY TIME:  Fluoroscopy Time: 8 minutes 42 seconds (274.5 mGy). COMPLICATIONS: None immediate. TECHNIQUE: Informed written consent was obtained from the patient after a thorough discussion of the procedural risks, benefits and alternatives. All questions were addressed. Maximal Sterile Barrier Technique was utilized including caps, mask, sterile gowns, sterile gloves, sterile drape, hand hygiene  and skin antiseptic. A timeout was performed prior to the initiation of the procedure. The right hand and wrist were prepped and draped in the usual sterile fashion. Real-time ultrasound guidance was utilized for vascular access including the acquisition of a permanent ultrasound image documenting patency of the accessed vessel. Using the modified Seldinger technique and a micropuncture kit, access was gained to the distal right radial artery at the anatomical snuffbox and a 5 French sheath was placed. Slow intra arterial infusion of 5,000 IU heparin, 5 mg Verapamil and 833 mcg nitroglicerin diluted in patient's own blood was performed. No significant fluctuation in patient's blood pressure seen. Then, a right radial artery roadmap was obtained via sheath side port. Normal brachial artery branching pattern seen. No significant anatomical variation. The right radial artery caliber is adequate for vascular access. Next, a 5 [place] Mckay 2 glide catheter was navigated over a 0.035" Terumo Glidewire into the right subclavian artery under fluoroscopic guidance. A subclavian artery roadmap was obtained. The catheter was then navigated under roadmap guidance into the right vertebral artery. A right vertebral artery angiogram with Townes and lateral views of the head were obtained. The catheter was then placed into the left common carotid artery. Frontal and lateral angiograms of the neck were obtained. The catheter was subsequently advanced into the left internal carotid artery under biplane roadmap. Frontal, lateral and magnified waters and Schller's view of the head were obtained. The catheter was retracted into the right common carotid artery and then advanced under biplane roadmap into the right external carotid artery. Frontal and lateral angiograms of the head were obtained. The catheter was subsequently withdrawn. An inflatable band was placed and inflated over the right hand access site. The vascular sheath was  withdrawn and the band was slowly deflated until brisk flow was noted through the arteriotomy site. At this point, the band was reinflated with additional 2 cc of air to obtain patent hemostasis. FINDINGS: Right radial artery ultrasound and right radial artery angiogram: The caliber of the distal right radial artery is appropriate for angiogram access. The right radial artery and the right ulnar artery have normal course and caliber. No significant anatomical variants noted. Right vertebral artery angiograms: The right vertebral artery, basilar artery, and bilateral posterior cerebral arteries are unremarkable. The left vertebral artery seen by contrast reflux in appear unremarkable. Luminal caliber is smooth and tapering. No aneurysms or abnormally high-flow, early draining veins are seen. No regions of abnormal hypervascularity are noted. The visualized dural sinuses are patent. Left CCA angiograms: Cervical angiograms show normal course and caliber of the visualized left common carotid and internal carotid arteries. There are no significant stenoses. Left ICA angiograms: There is brisk vascular contrast filling of the left ACA and MCA vascular trees. The right ACA vascular tree is opacified via a prominent anterior communicating artery. Incidental note made of fenestration of the proximal left A2/ACA. Luminal caliber is smooth and tapering. No aneurysms or abnormally high-flow, early draining veins  are seen. No regions of abnormal hypervascularity are noted. The visualized dural sinuses are patent. Right CCA angiograms: Cervical angiograms show normal course and caliber of the visualized right common carotid and internal carotid arteries. There are no significant stenoses. Right ICA angiograms: There is brisk vascular contrast filling of the right ACA and MCA vascular trees. The left ACA vascular trees opacify via a prominent anterior communicating artery. Luminal caliber is smooth and tapering. No aneurysms or  abnormally high-flow, early draining veins are seen. No regions of abnormal hypervascularity are noted. The visualized dural sinuses are patent. Right ECA and right occipital angiograms: No early venous drainage was noted. The cranial branches of the right external carotid artery are unremarkable. PROCEDURE: No intervention performed. IMPRESSION: Unremarkable diagnostic cerebral angiogram. Specifically, no evidence of an AVM, dural AV fistula or other vascular abnormality that could explain patient's known intraparenchymal hemorrhage. PLAN: Per Neurology. Electronically Signed   By: Pedro Earls M.D.   On: 04/27/2020 13:48   IR ANGIO EXTERNAL CAROTID SEL EXT CAROTID UNI R MOD SED  Result Date: 04/27/2020 INDICATION: 25 year old female with a past medical history significant for ITP and lupus. She presented to [facility] ED 04/24/2020 with complaints of left-sided numbness and headache. In ED, CT head revealed a medium-sized mass-like lesion in the right parietal lobe with a large amount of surrounding edema. MRI showed prominent susceptibility artifact associated with the area of edema, concerning for a subacute hemorrhage. She was transferred and admitted to [facility] for further management. She comes to our service to the right diagnostic cerebral angiogram to evaluate for possible vascular cause of hemorrhage. EXAM: ULTRASOUND-GUIDED VASCULAR ACCESS DIAGNOSTIC CEREBRAL ANGIOGRAM COMPARISON:  MR angiogram April 24, 2020 MEDICATIONS: No antibiotics administered. ANESTHESIA/SEDATION: Versed 1 mg IV; Fentanyl 25 mcg IV Moderate Sedation Time:  44 minutes The patient was continuously monitored during the procedure by the interventional radiology nurse under my direct supervision. FLUOROSCOPY TIME:  Fluoroscopy Time: 8 minutes 42 seconds (274.5 mGy). COMPLICATIONS: None immediate. TECHNIQUE: Informed written consent was obtained from the patient after a thorough discussion of the procedural risks, benefits and  alternatives. All questions were addressed. Maximal Sterile Barrier Technique was utilized including caps, mask, sterile gowns, sterile gloves, sterile drape, hand hygiene and skin antiseptic. A timeout was performed prior to the initiation of the procedure. The right hand and wrist were prepped and draped in the usual sterile fashion. Real-time ultrasound guidance was utilized for vascular access including the acquisition of a permanent ultrasound image documenting patency of the accessed vessel. Using the modified Seldinger technique and a micropuncture kit, access was gained to the distal right radial artery at the anatomical snuffbox and a 5 French sheath was placed. Slow intra arterial infusion of 5,000 IU heparin, 5 mg Verapamil and 147 mcg nitroglicerin diluted in patient's own blood was performed. No significant fluctuation in patient's blood pressure seen. Then, a right radial artery roadmap was obtained via sheath side port. Normal brachial artery branching pattern seen. No significant anatomical variation. The right radial artery caliber is adequate for vascular access. Next, a 5 [place] Levins 2 glide catheter was navigated over a 0.035" Terumo Glidewire into the right subclavian artery under fluoroscopic guidance. A subclavian artery roadmap was obtained. The catheter was then navigated under roadmap guidance into the right vertebral artery. A right vertebral artery angiogram with Townes and lateral views of the head were obtained. The catheter was then placed into the left common carotid artery. Frontal and lateral angiograms of the  neck were obtained. The catheter was subsequently advanced into the left internal carotid artery under biplane roadmap. Frontal, lateral and magnified waters and Schller's view of the head were obtained. The catheter was retracted into the right common carotid artery and then advanced under biplane roadmap into the right external carotid artery. Frontal and lateral  angiograms of the head were obtained. The catheter was subsequently withdrawn. An inflatable band was placed and inflated over the right hand access site. The vascular sheath was withdrawn and the band was slowly deflated until brisk flow was noted through the arteriotomy site. At this point, the band was reinflated with additional 2 cc of air to obtain patent hemostasis. FINDINGS: Right radial artery ultrasound and right radial artery angiogram: The caliber of the distal right radial artery is appropriate for angiogram access. The right radial artery and the right ulnar artery have normal course and caliber. No significant anatomical variants noted. Right vertebral artery angiograms: The right vertebral artery, basilar artery, and bilateral posterior cerebral arteries are unremarkable. The left vertebral artery seen by contrast reflux in appear unremarkable. Luminal caliber is smooth and tapering. No aneurysms or abnormally high-flow, early draining veins are seen. No regions of abnormal hypervascularity are noted. The visualized dural sinuses are patent. Left CCA angiograms: Cervical angiograms show normal course and caliber of the visualized left common carotid and internal carotid arteries. There are no significant stenoses. Left ICA angiograms: There is brisk vascular contrast filling of the left ACA and MCA vascular trees. The right ACA vascular tree is opacified via a prominent anterior communicating artery. Incidental note made of fenestration of the proximal left A2/ACA. Luminal caliber is smooth and tapering. No aneurysms or abnormally high-flow, early draining veins are seen. No regions of abnormal hypervascularity are noted. The visualized dural sinuses are patent. Right CCA angiograms: Cervical angiograms show normal course and caliber of the visualized right common carotid and internal carotid arteries. There are no significant stenoses. Right ICA angiograms: There is brisk vascular contrast filling of  the right ACA and MCA vascular trees. The left ACA vascular trees opacify via a prominent anterior communicating artery. Luminal caliber is smooth and tapering. No aneurysms or abnormally high-flow, early draining veins are seen. No regions of abnormal hypervascularity are noted. The visualized dural sinuses are patent. Right ECA and right occipital angiograms: No early venous drainage was noted. The cranial branches of the right external carotid artery are unremarkable. PROCEDURE: No intervention performed. IMPRESSION: Unremarkable diagnostic cerebral angiogram. Specifically, no evidence of an AVM, dural AV fistula or other vascular abnormality that could explain patient's known intraparenchymal hemorrhage. PLAN: Per Neurology. Electronically Signed   By: Pedro Earls M.D.   On: 04/27/2020 13:48    ASSESSMENT AND PLAN: 1. Right parietal intraparenchymal hematoma 2. History of ITP 3. Elevated PTT 4.  Microcytic anemia 5. Lupus 6. Hypertension 7. Dyslipidemia  -The patient has been seen by neurosurgery and neurology.  No surgical intervention recommended.  Neurology is going to check with rheumatology regarding the possibility of starting the patient on steroids. -Labs on admission have been reviewed.  Fortunately, her platelet count is slightly above normal range.  There is no evidence of ITP flare.  The patient has been on Promacta 50 mg daily.  Recommend for the patient to continue this medication. -The patient has an elevated PTT of unclear etiology.  No prior PTT recorded.  Further work-up/recommendations per Dr. Lindi Adie. -The patient is noted to have microcytic anemia.  I have added  on a ferritin and iron studies.  If she is found to be iron deficient, we can consider her for IV iron. -Management of chronic medical conditions per hospitalist.   LOS: 3 days   Mikey Bussing, DNP, AGPCNP-BC, AOCNP 04/27/20   Attending Note  I personally saw the patient, reviewed the chart  and examined the patient.   1.  Right parietal intraparenchymal hematoma: Unclear etiology 2. ITP: No active issues with platelets 3.  Prolonged PTT with normal PT: Causes include deficiencies of factors April 20, 2010, von Willebrand disease, anticoagulants or lupus anticoagulant.  We will check some of these levels. 4. Microcytic anemia iron studies ordered

## 2020-04-27 NOTE — Progress Notes (Signed)
OT Cancellation Note  Patient Details Name: Rebecca Bright MRN: [id number] DOB: 10-12-1995   Cancelled Treatment:    Reason Eval/Treat Not Completed: Patient at procedure or test/ unavailable- pt off unit for procedure.  Will follow and see as able.   Barry Brunner, OT Acute Rehabilitation Services Pager [phone] Office [phone]   Chancy Milroy 04/27/2020, 8:24 AM

## 2020-04-27 NOTE — Consult Note (Signed)
Reason for Consult: Right parietal lesion Referring Physician: Neurology  Rebecca Bright is an 24 y.o. female.  HPI: 24 year old female with history of lupus presents with a 6-day history of headache.  Patient progressively noted some paresthesias and numbness in her right leg and arm.  Patient also with symptoms facial difficulty and trouble with coordination.  Patient's headache persists.  No other ongoing issues.  No history of malignancy.  Patient currently on Plaquenil for her lupus.  CT scan demonstrated a hypodensity in her right high parietal region worrisome for neoplasm.  Follow-up MRI scanning including MRA and MRV studies demonstrate no evidence of obvious mass lesion/neoplasm.  There is an area of edematous brain worrisome for infarct with some secondary hemorrhage.  There is minimal if any mass-effect.  There is no evidence of dural venous sinus thrombosis or obvious vasculitis.  Past Medical History:  Diagnosis Date  . History of ITP   . Lupus [facility])     Past Surgical History:  Procedure Laterality Date  . HERNIA REPAIR      Family History  Problem Relation Age of Onset  . Hypertension Mother   . Diabetes Father   . High Cholesterol Father     Social History:  reports that she has never smoked. She has never used smokeless tobacco. She reports that she does not drink alcohol and does not use drugs.  Allergies:  Allergies  Allergen Reactions  . Sulfa Antibiotics Other (See Comments)    Lupus increase   . Sulfur Other (See Comments)    Lupus Activity    Medications: I have reviewed the patient's current medications.   Results for orders placed or performed during the hospital encounter of 04/24/20 (from the past 48 hour(s))  CBC with Differential/Platelet     Status: Abnormal   Collection Time: 04/26/20  9:52 AM  Result Value Ref Range   WBC 5.2 4.0 - 10.5 K/uL   RBC 5.04 3.87 - 5.11 MIL/uL   Hemoglobin 8.8 (L) 12.0 - 15.0 g/dL    Comment: Reticulocyte  Hemoglobin testing may be clinically indicated, consider ordering this additional test [id number]    HCT 32.4 (L) 36 - 46 %   MCV 64.3 (L) 80.0 - 100.0 fL   MCH 17.5 (L) 26.0 - 34.0 pg   MCHC 27.2 (L) 30.0 - 36.0 g/dL   RDW 30.8 (H) 65.7 - 84.6 %   Platelets 404 (H) 150 - 400 K/uL   nRBC 0.0 0.0 - 0.2 %   Neutrophils Relative % 56 %   Neutro Abs 2.9 1.7 - 7.7 K/uL   Lymphocytes Relative 33 %   Lymphs Abs 1.7 0.7 - 4.0 K/uL   Monocytes Relative 8 %   Monocytes Absolute 0.4 0 - 1 K/uL   Eosinophils Relative 2 %   Eosinophils Absolute 0.1 0 - 0 K/uL   Basophils Relative 1 %   Basophils Absolute 0.0 0 - 0 K/uL   Immature Granulocytes 0 %   Abs Immature Granulocytes 0.01 0.00 - 0.07 K/uL    Comment: Performed at [facility], [address] [place], [place] [address]    No results found.  A comprehensive review of systems was negative. Blood pressure 113/89, pulse 84, temperature 99 F (37.2 C), temperature source Oral, resp. rate 17, height 4\' 10"  (1.473 m), weight 38.1 kg, last menstrual period 04/16/2020, SpO2 100 %. Patient is awake and alert.  She is oriented and appropriate.  Her speech is fluent.  Judgment and  insight are intact.  Cranial nerve function normal bilateral.  Motor examination with 5/5 strength bilateral.  Sensory examination with some mild decrease in her left hemibody.  Reflexes are slightly increased on the left.  Finger-to-nose and heel-to-shin testing negative bilaterally.  Examination head ears eyes nose throat unremarkable chest and abdomen are benign.  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  I see no convincing evidence of neoplasm at this time.  Patient should continue with her immunosuppression for lupus disease.  I discussed with neurology regarding possible steroid administration they plan to discuss things further with her rheumatologist.  I do not see any  indication for any biopsy at this point.  I would recommend a follow-up MRI scan in 2 months.  Should the lesion progresses or her symptoms progressively interim then we would certainly want to move forward sooner.  Sherilyn Cooter A Cherly Erno 04/27/2020, 11:56 AM

## 2020-04-28 DIAGNOSIS — E611 Iron deficiency: Secondary | ICD-10-CM

## 2020-04-28 LAB — C4 COMPLEMENT: Complement C4, Body Fluid: 17 mg/dL (ref 12–38)

## 2020-04-28 LAB — C3 COMPLEMENT: C3 Complement: 184 mg/dL — ABNORMAL HIGH (ref 82–167)

## 2020-04-28 LAB — COMPLEMENT, TOTAL: Compl, Total (CH50): >60 U/mL (ref >41)

## 2020-04-28 NOTE — Progress Notes (Signed)
Patient has been discharged via wheelchair home. All tele has been disconnected and IV removed. AVS documentation given and reviewed. Mother at bedside. Pt denies pain and further questioning. Doctor's note printed and given to patient. Belongings are sent with the patient. Pt sent in good spirits.

## 2020-04-28 NOTE — Progress Notes (Signed)
HEMATOLOGY-ONCOLOGY [facility] VIDEO VISIT PROGRESS NOTE  I connected with Rebecca Bright on 04/30/2020 at  8:00 AM EDT by MyChart video conference and verified that I am speaking with the correct person using two identifiers.  I discussed the limitations, risks, security and privacy concerns of performing an evaluation and management service by MyChart and the availability of in person appointments.  I also discussed with the patient that there may be a patient responsible charge related to this service. The patient expressed understanding and agreed to proceed.  Patient's Location: Home Physician Location: Clinic  CHIEF COMPLIANT: Follow-up of ITP and Lupus, follow-up of hospitalization  INTERVAL HISTORY: Rebecca Bright is a 24 y.o. female with above-mentioned history of ITP and Lupus currently onPromacta and dexamethasone. She was admitted from 04/24/20-04/28/20 after presenting to the ED for left-sided numbness and weakness. CT head showed right frontoparietal hypodensity, 4 cm, likely hemorrhagic. Brain MRI showed parenchymal hematoma along the parasagittal right parietal lobe with surrounding edema and mild regional mass-effect. She presents over MyChart today for follow-up.   Observations/Objective:  There were no vitals filed for this visit. There is no height or weight on file to calculate BMI.  I have reviewed the data as listed CMP Latest Ref Rng & Units 04/24/2020  Glucose 70 - 99 mg/dL 120(H)  BUN 6 - 20 mg/dL 10  Creatinine 0.44 - 1.00 mg/dL 0.72  Sodium 135 - 145 mmol/L 136  Potassium 3.5 - 5.1 mmol/L 3.5  Chloride 98 - 111 mmol/L 103  CO2 22 - 32 mmol/L 24  Calcium 8.9 - 10.3 mg/dL 9.2  Total Protein 6.5 - 8.1 g/dL 9.7(H)  Total Bilirubin 0.3 - 1.2 mg/dL <0.1(L)  Alkaline Phos 38 - 126 U/L 58  AST 15 - 41 U/L 20  ALT 0 - 44 U/L 14    Lab Results  Component Value Date   WBC 5.2 04/26/2020   HGB 8.8 (L) 04/26/2020   HCT 32.4 (L) 04/26/2020   MCV 64.3 (L)  04/26/2020   PLT 404 (H) 04/26/2020   NEUTROABS 2.9 04/26/2020      Assessment Plan:  ICH (intracerebral hemorrhage) ([place])  ITP secondary to SLE Prior treatment: 1.Original bleed diagnosed February 2016 clinically when she was stationed there 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 She had a bone marrow biopsy there and was treated with IVIG prednisone and rituximab. 2.Frequent relapses every 6 months. She required IVIG prednisone and Rituxan. 3.Moved to [place] and relapsed 07/06/2015, 02/27/2016 at [facility] 02/27/2016: 4 days of Decadron and IVIG and hydroxyurea for lupus(platelet count of 12)followed by Rituxan 07/06/2015: Hospitalization for ITP: Steroids with IVIG 4.Patient investigated and found dapsone 100 mg daily which has been preventing relapses of ITP. 5.10/24/2018: Platelets 49 when she ran out of dapsone. 6.Relapse 01/27/2019: Dexamethasone went into remission 7.05/17/2019: Relapse platelet count 17 8. Dexamethasone4 mg dailywith Promacta 50 mg daily  ------------------------------------------------------------------------------------------------------------------------------------------------------ Hospitalization: 04/24/20-04/28/20: Intra cerebral hemorrhage (platelets > 400K), No surg intervention Angiograms: No aneurysms No clear cut etiology. PTT was prolonged (workup Negative; Factors 8,9,11 and 12 were normal) Iron def anemia: serum iron 33, transferrin saturation 8, ferritin 5, TIBC 403. received 1 dose of IV Iron Offered additional IV Iron treatments Vs oral iron replacement therapy.  ITP: Remission, continue same Rx  Lupus: on hydroxychloroquine     I discussed the assessment and treatment plan with the patient. The patient was provided an opportunity to ask questions and all were answered. The patient agreed with  the plan and demonstrated an understanding of the instructions. The patient was advised to call back or seek  an in-person evaluation if the symptoms worsen or if the condition fails to improve as anticipated.   I provided 15 minutes of face-to-face MyChart video visit time during this encounter.    Rulon Eisenmenger, MD 04/30/2020   I, Molly Dorshimer, am acting as scribe for Nicholas Lose, MD.  I have reviewed the above documentation for accuracy and completeness, and I agree with the above.

## 2020-04-28 NOTE — Discharge Summary (Signed)
Physician Discharge Summary  Rebecca Bright QIH:[id number] DOB: May 26, 1996 DOA: 04/24/2020  PCP: Keith Rake, MD  Admit date: 04/24/2020 Discharge date: 04/28/2020  Admitted From: home  Disposition:  Home   Recommendations for Outpatient Follow-up and new medication changes:  1. Follow up with Dr. Olevia Bowens in 7 days.  2. Follow up with Dr. Mickeal Skinner neuro-oncology  3. Follow up with Dr. Lindi Adie hematology as scheduled 4. Follow up with Neurology as scheduled.   Home Health: no   Equipment/Devices: no    Discharge Condition: stable  CODE STATUS: full  Diet recommendation: heart healthy   Brief/Interim Summary: Patient admitted to the hospital with a working diagnosis of medium sized right parietal frontal intracerebral hemorrhage with surrounding edema.  24 year old female with significant past medical history for ITP and systemic lupus erythematous. She reported left upper extremity and left lower extremity numbness and weakness for 24 hours prior to hospitalization. Her symptoms were of gradual onset, associated with difficulty ambulating and mild confusion. On her initial physical examination blood pressure 132/91, heart rate 108, temperature 99.4, respiratory rate 21, oxygen saturation 100%. Her lungs were clear to auscultation bilaterally, heart S1-S2, present rhythmic, abdomen soft, no lower extremity edema, no rashes. Sodium 136, potassium 3.5, chloride 103, bicarb 24, glucose 120, BUN 10, creatinine 0.72, white count 8.5, hemoglobin 9.3, hematocrit 33.7, platelets 461. SARS COVID-19 negative. Urinalysis negative for infection. Drug screen negative. CT with right frontoparietal hypodensity, 4 cm, likely hemorrhagic. Brain MRI with parenchymal hematoma along the parasagittal right parietal lobe with surrounding edema and mild regional mass-effect. EKG 99 bpm, normal axis, right bundle branch block, normal intervals, sinus rhythm, no ST segment or T wave changes.  Patient  was admitted to the intensive care unit for close neurologic monitoring. Neurosurgery was consulted, no indication for surgical intervention. Further workup with cerebral catheter angiogram showed no evidence of avm, davf or other vascular abnormalities.   Patient's symptoms have improved. She will follow up as outpatient.   1.  New parenchymal hematoma at the right parietal lobe with local edema and mild regional mass-effect.  Patient was admitted to the intensive care unit for frequent neurologic monitoring.  Patient symptoms slowly improved.  She underwent further work-up with cerebral angiogram which showed no AVM, d-aVF, or other major vascular abnormalities that can explain intracranial bleed.  Patient was evaluated by neurosurgery and hematology.  Etiology possible post vasculitic infarct, also the possibility of neoplasm was raised.  Neurosurgery recommended outpatient follow-up, no indication for biopsy at this point. Patient will follow up with rheumatology as an outpatient, at this point clinically she does not have any acute flare. She also has been referred to neuro-oncology for outpatient follow-up  Her ITP was deemed to be stable.  2.  Lupus systemic erythematosus.  Patient has no signs of active lupus flare.  Patient will continue hydroxychloroquine.  Follow-up with rheumatology as an outpatient.  Renal function stable with no proteinuria.  3.  Hypertension.  Her blood pressure remained well controlled no indication for antihypertensive agents  4.  Dyslipidemia.  Of statins, follow-up as an outpatient.  5.  ITP/iron deficiency anemia.  Iron studies revealed serum iron 33, transferrin saturation 8, ferritin 5, TIBC 403. Patient received 1 dose of IV iron.  Continue Promacta.   Discharge Diagnoses:  Principal Problem:   ICH (intracerebral hemorrhage) ([place]) Active Problems:   Intracranial edema (HCC)   Intraparenchymal hematoma of brain (HCC)   Lupus (HCC)   Iron  deficiency    Discharge  Instructions  Discharge Instructions    Ambulatory referral to Neurology   Complete by: As directed    Follow up in stroke clinic at [facility] with in about 4 weeks w/ Dr. Antony Contras, Dr. Bess Harvest, or Dr. Sarina Ill.   Ambulatory referral to Physical Therapy   Complete by: As directed      Allergies as of 04/28/2020      Reactions   Sulfa Antibiotics Other (See Comments)   Lupus increase    Sulfur Other (See Comments)   Lupus Activity      Medication List    TAKE these medications   acetaminophen 325 MG tablet Commonly known as: TYLENOL Take 325-650 mg by mouth every 6 (six) hours as needed for mild pain or headache.   Camila 0.35 MG tablet Generic drug: norethindrone Take 1 tablet by mouth daily.   hydroxychloroquine 200 MG tablet Commonly known as: PLAQUENIL TAKE 1 TABLET BY MOUTH EVERY DAY   Promacta 50 MG tablet Generic drug: eltrombopag TAKE 1 TABLET (50 MG TOTAL) BY MOUTH DAILY. TAKE ON AN EMPTY STOMACH 1 HOUR BEFORE A MEAL OR 2 HOURS AFTER       Follow-up Information    [facility] Follow up.   Specialty: Rehabilitation Why: The outpatient therapy will contact you for the first appointment Contact information: [place] [facility]. Schedule an appointment as soon as possible for a visit in 4 week(s).   Specialty: Neurology Contact information: [address] [place] [phone]       Keith Rake, MD Follow up in 1 week(s).   Specialty: Family Medicine Contact information: [address] [place] [address] [phone]              Allergies  Allergen Reactions   Sulfa Antibiotics Other (See Comments)    Lupus increase    Sulfur Other (See Comments)    Lupus Activity     Consultations:  Neurology   Neurosurgery   IR    Procedures/Studies: CT Head Wo Contrast  Result Date: 04/24/2020 CLINICAL DATA:  Numbness and heaviness of the LEFT leg EXAM: CT HEAD WITHOUT CONTRAST TECHNIQUE: Contiguous axial images were obtained from the base of the skull through the vertex without intravenous contrast. COMPARISON:  None. FINDINGS: Brain: There is masslike hypodensity within the RIGHT frontoparietal vertex. There is central punctate hyperdensity within this white matter hypodensity. This spans approximately 4 cm. Vascular: No hyperdense vessel or unexpected calcification. Skull: Normal. Negative for fracture or focal lesion. Sinuses/Orbits: No acute finding. Other: None. IMPRESSION: There is masslike hypodensity within the RIGHT frontoparietal vertex. There is central punctate hyperdensity within this white matter hypodensity. This spans approximately 4 cm. Findings are concerning for a mass with associated hemorrhage. Infarction is felt less likely but venous infarction is on the differential. Recommend further evaluation with contrast-enhanced MRI of the brain. These results were called by telephone at the time of interpretation on 04/24/2020 at 12:16 pm to provider [facility] , who verbally acknowledged these results. Electronically Signed   By: Valentino Saxon MD   On: 04/24/2020 12:20   MR ANGIO HEAD WO CONTRAST  Addendum Date: 04/24/2020   ADDENDUM REPORT: 04/24/2020 16:55 ADDENDUM: Area of hemorrhage measures approximately 5.5 x 3.1 x 5.2 cm on SWI. There are a few small areas of intrinsic T1 shortening reflecting subacute blood products. However,  the majority of the hemorrhage appears to be acute despite lack of significant hyperdensity on CT. Electronically Signed   By: Macy Mis M.D.   On: 04/24/2020 16:55   Result Date: 04/24/2020 CLINICAL DATA:  Left-sided weakness and numbness with abnormal CT, per EMR history of ITP secondary to lupus EXAM: MRI HEAD  WITHOUT AND WITH CONTRAST MRA HEAD WITHOUT CONTRAST MRA NECK WITHOUT AND WITH CONTRAST TECHNIQUE: Multiplanar, multiecho pulse sequences of the brain and surrounding structures were obtained without and with intravenous contrast. Angiographic images of the Circle of Willis were obtained using MRA technique without intravenous contrast. Angiographic images of the neck were obtained using MRA technique without and with intravenous contrast. Carotid stenosis measurements (when applicable) are obtained utilizing NASCET criteria, using the distal internal carotid diameter as the denominator. CONTRAST:  39m GADAVIST GADOBUTROL 1 MMOL/ML IV SOLN COMPARISON:  Correlation made with same day CT FINDINGS: MRI HEAD Brain: Heterogeneous signal is identified along the parasagittal right parietal lobe with moderate surrounding edema. There is marked corresponding susceptibility reflecting blood products. Regional mass effect is present. There is no evidence of intraventricular extension. There is no abnormal enhancement. A punctate focus of susceptibility along the right caudate likely reflects a chronic microhemorrhage. Vascular: Major vessel flow voids at the skull base are preserved. Skull and upper cervical spine: Normal marrow signal is preserved. Sinuses/Orbits: Paranasal sinuses are aerated. Orbits are unremarkable. Other: Sella is unremarkable.  Mastoid air cells are clear. MRA HEAD Intracranial internal carotid arteries are patent. Middle and anterior cerebral arteries are patent. Intracranial vertebral arteries, basilar artery, posterior cerebral arteries are patent. There is no significant stenosis or aneurysm. MRA NECK Great vessel origins are patent. Common, internal, and external carotid arteries are patent. Extracranial vertebral arteries are patent. There is no measurable stenosis. IMPRESSION: Large area of heterogeneous signal along the parasagittal right parietal lobe with surrounding edema and mild regional mass  effect. Appearance is most consistent with a parenchymal hematoma without evidence of underlying lesion. Unremarkable vascular imaging. Electronically Signed: By: PMacy MisM.D. On: 04/24/2020 15:25   MR Angiogram Neck W or Wo Contrast  Addendum Date: 04/24/2020   ADDENDUM REPORT: 04/24/2020 16:55 ADDENDUM: Area of hemorrhage measures approximately 5.5 x 3.1 x 5.2 cm on SWI. There are a few small areas of intrinsic T1 shortening reflecting subacute blood products. However, the majority of the hemorrhage appears to be acute despite lack of significant hyperdensity on CT. Electronically Signed   By: PMacy MisM.D.   On: 04/24/2020 16:55   Result Date: 04/24/2020 CLINICAL DATA:  Left-sided weakness and numbness with abnormal CT, per EMR history of ITP secondary to lupus EXAM: MRI HEAD WITHOUT AND WITH CONTRAST MRA HEAD WITHOUT CONTRAST MRA NECK WITHOUT AND WITH CONTRAST TECHNIQUE: Multiplanar, multiecho pulse sequences of the brain and surrounding structures were obtained without and with intravenous contrast. Angiographic images of the Circle of Willis were obtained using MRA technique without intravenous contrast. Angiographic images of the neck were obtained using MRA technique without and with intravenous contrast. Carotid stenosis measurements (when applicable) are obtained utilizing NASCET criteria, using the distal internal carotid diameter as the denominator. CONTRAST:  479mGADAVIST GADOBUTROL 1 MMOL/ML IV SOLN COMPARISON:  Correlation made with same day CT FINDINGS: MRI HEAD Brain: Heterogeneous signal is identified along the parasagittal right parietal lobe with moderate surrounding edema. There is marked corresponding susceptibility reflecting blood products. Regional mass effect is present. There is no evidence of intraventricular extension. There is no abnormal enhancement.  A punctate focus of susceptibility along the right caudate likely reflects a chronic microhemorrhage. Vascular: Major  vessel flow voids at the skull base are preserved. Skull and upper cervical spine: Normal marrow signal is preserved. Sinuses/Orbits: Paranasal sinuses are aerated. Orbits are unremarkable. Other: Sella is unremarkable.  Mastoid air cells are clear. MRA HEAD Intracranial internal carotid arteries are patent. Middle and anterior cerebral arteries are patent. Intracranial vertebral arteries, basilar artery, posterior cerebral arteries are patent. There is no significant stenosis or aneurysm. MRA NECK Great vessel origins are patent. Common, internal, and external carotid arteries are patent. Extracranial vertebral arteries are patent. There is no measurable stenosis. IMPRESSION: Large area of heterogeneous signal along the parasagittal right parietal lobe with surrounding edema and mild regional mass effect. Appearance is most consistent with a parenchymal hematoma without evidence of underlying lesion. Unremarkable vascular imaging. Electronically Signed: By: Macy Mis M.D. On: 04/24/2020 15:25   MR Brain W and Wo Contrast  Addendum Date: 04/24/2020   ADDENDUM REPORT: 04/24/2020 16:55 ADDENDUM: Area of hemorrhage measures approximately 5.5 x 3.1 x 5.2 cm on SWI. There are a few small areas of intrinsic T1 shortening reflecting subacute blood products. However, the majority of the hemorrhage appears to be acute despite lack of significant hyperdensity on CT. Electronically Signed   By: Macy Mis M.D.   On: 04/24/2020 16:55   Result Date: 04/24/2020 CLINICAL DATA:  Left-sided weakness and numbness with abnormal CT, per EMR history of ITP secondary to lupus EXAM: MRI HEAD WITHOUT AND WITH CONTRAST MRA HEAD WITHOUT CONTRAST MRA NECK WITHOUT AND WITH CONTRAST TECHNIQUE: Multiplanar, multiecho pulse sequences of the brain and surrounding structures were obtained without and with intravenous contrast. Angiographic images of the Circle of Willis were obtained using MRA technique without intravenous contrast.  Angiographic images of the neck were obtained using MRA technique without and with intravenous contrast. Carotid stenosis measurements (when applicable) are obtained utilizing NASCET criteria, using the distal internal carotid diameter as the denominator. CONTRAST:  66m GADAVIST GADOBUTROL 1 MMOL/ML IV SOLN COMPARISON:  Correlation made with same day CT FINDINGS: MRI HEAD Brain: Heterogeneous signal is identified along the parasagittal right parietal lobe with moderate surrounding edema. There is marked corresponding susceptibility reflecting blood products. Regional mass effect is present. There is no evidence of intraventricular extension. There is no abnormal enhancement. A punctate focus of susceptibility along the right caudate likely reflects a chronic microhemorrhage. Vascular: Major vessel flow voids at the skull base are preserved. Skull and upper cervical spine: Normal marrow signal is preserved. Sinuses/Orbits: Paranasal sinuses are aerated. Orbits are unremarkable. Other: Sella is unremarkable.  Mastoid air cells are clear. MRA HEAD Intracranial internal carotid arteries are patent. Middle and anterior cerebral arteries are patent. Intracranial vertebral arteries, basilar artery, posterior cerebral arteries are patent. There is no significant stenosis or aneurysm. MRA NECK Great vessel origins are patent. Common, internal, and external carotid arteries are patent. Extracranial vertebral arteries are patent. There is no measurable stenosis. IMPRESSION: Large area of heterogeneous signal along the parasagittal right parietal lobe with surrounding edema and mild regional mass effect. Appearance is most consistent with a parenchymal hematoma without evidence of underlying lesion. Unremarkable vascular imaging. Electronically Signed: By: PMacy MisM.D. On: 04/24/2020 15:25   IR [place] Vasc Access Right  Result Date: 04/27/2020 INDICATION: 24year old female with a past medical history significant  for ITP and lupus. [facility] with complaints of left-sided numbness and headache. In ED, CT  head revealed a medium-sized mass-like lesion in the right parietal lobe with a large amount of surrounding edema. MRI showed prominent susceptibility artifact associated with the area of edema, concerning for a subacute hemorrhage. She was transferred and admitted to [facility] for further management. She comes to our service to the right diagnostic cerebral angiogram to evaluate for possible vascular cause of hemorrhage. EXAM: ULTRASOUND-GUIDED VASCULAR ACCESS DIAGNOSTIC CEREBRAL ANGIOGRAM COMPARISON:  MR angiogram April 24, 2020 MEDICATIONS: No antibiotics administered. ANESTHESIA/SEDATION: Versed 1 mg IV; Fentanyl 25 mcg IV Moderate Sedation Time:  44 minutes The patient was continuously monitored during the procedure by the interventional radiology nurse under my direct supervision. FLUOROSCOPY TIME:  Fluoroscopy Time: 8 minutes 42 seconds (274.5 mGy). COMPLICATIONS: None immediate. TECHNIQUE: Informed written consent was obtained from the patient after a thorough discussion of the procedural risks, benefits and alternatives. All questions were addressed. Maximal Sterile Barrier Technique was utilized including caps, mask, sterile gowns, sterile gloves, sterile drape, hand hygiene and skin antiseptic. A timeout was performed prior to the initiation of the procedure. The right hand and wrist were prepped and draped in the usual sterile fashion. Real-time ultrasound guidance was utilized for vascular access including the acquisition of a permanent ultrasound image documenting patency of the accessed vessel. Using the modified Seldinger technique and a micropuncture kit, access was gained to the distal right radial artery at the anatomical snuffbox and a 5 French sheath was placed. Slow intra arterial infusion of 5,000 IU heparin, 5 mg Verapamil and 213 mcg nitroglicerin diluted in patient's own blood was  performed. No significant fluctuation in patient's blood pressure seen. Then, a right radial artery roadmap was obtained via sheath side port. Normal brachial artery branching pattern seen. No significant anatomical variation. The right radial artery caliber is adequate for vascular access. Next, a 5 [place] 2 glide catheter was navigated over a 0.035" Terumo Glidewire into the right subclavian artery under fluoroscopic guidance. A subclavian artery roadmap was obtained. The catheter was then navigated under roadmap guidance into the right vertebral artery. A right vertebral artery angiogram with Townes and lateral views of the head were obtained. The catheter was then placed into the left common carotid artery. Frontal and lateral angiograms of the neck were obtained. The catheter was subsequently advanced into the left internal carotid artery under biplane roadmap. Frontal, lateral and magnified waters and Schller's view of the head were obtained. The catheter was retracted into the right common carotid artery and then advanced under biplane roadmap into the right external carotid artery. Frontal and lateral angiograms of the head were obtained. The catheter was subsequently withdrawn. An inflatable band was placed and inflated over the right hand access site. The vascular sheath was withdrawn and the band was slowly deflated until brisk flow was noted through the arteriotomy site. At this point, the band was reinflated with additional 2 cc of air to obtain patent hemostasis. FINDINGS: Right radial artery ultrasound and right radial artery angiogram: The caliber of the distal right radial artery is appropriate for angiogram access. The right radial artery and the right ulnar artery have normal course and caliber. No significant anatomical variants noted. Right vertebral artery angiograms: The right vertebral artery, basilar artery, and bilateral posterior cerebral arteries are unremarkable. The left  vertebral artery seen by contrast reflux in appear unremarkable. Luminal caliber is smooth and tapering. No aneurysms or abnormally high-flow, early draining veins are seen. No regions of abnormal hypervascularity are noted. The visualized dural sinuses are patent.  Left CCA angiograms: Cervical angiograms show normal course and caliber of the visualized left common carotid and internal carotid arteries. There are no significant stenoses. Left ICA angiograms: There is brisk vascular contrast filling of the left ACA and MCA vascular trees. The right ACA vascular tree is opacified via a prominent anterior communicating artery. Incidental note made of fenestration of the proximal left A2/ACA. Luminal caliber is smooth and tapering. No aneurysms or abnormally high-flow, early draining veins are seen. No regions of abnormal hypervascularity are noted. The visualized dural sinuses are patent. Right CCA angiograms: Cervical angiograms show normal course and caliber of the visualized right common carotid and internal carotid arteries. There are no significant stenoses. Right ICA angiograms: There is brisk vascular contrast filling of the right ACA and MCA vascular trees. The left ACA vascular trees opacify via a prominent anterior communicating artery. Luminal caliber is smooth and tapering. No aneurysms or abnormally high-flow, early draining veins are seen. No regions of abnormal hypervascularity are noted. The visualized dural sinuses are patent. Right ECA and right occipital angiograms: No early venous drainage was noted. The cranial branches of the right external carotid artery are unremarkable. PROCEDURE: No intervention performed. IMPRESSION: Unremarkable diagnostic cerebral angiogram. Specifically, no evidence of an AVM, dural AV fistula or other vascular abnormality that could explain patient's known intraparenchymal hemorrhage. PLAN: Per Neurology. Electronically Signed   By: Pedro Earls M.D.    On: 04/27/2020 13:48   MR MRV HEAD W WO CONTRAST  Result Date: 04/24/2020 CLINICAL DATA:  Headache EXAM: MR VENOGRAM HEAD WITHOUT AND WITH CONTRAST TECHNIQUE: Angiographic images of the intracranial venous structures were obtained using MRV technique without and with intravenous contrast. CONTRAST:  45m GADAVIST GADOBUTROL 1 MMOL/ML IV SOLN COMPARISON:  04/24/2020 head CT.  04/24/2020 MRI/MRA head. FINDINGS: Normal appearance of the dural venous sinuses. No evidence of thrombosis or high-grade narrowing. No vascular malformation identified. No evidence of vascular injury involving the dural venous sinuses. Redemonstration of parasagittal intraparenchymal hematoma involving the right parietal region. IMPRESSION: Normal appearance of the dural venous sinuses. Sequela of evolving right parietal intraparenchymal hematoma. Electronically Signed   By: CPrimitivo GauzeM.D.   On: 04/24/2020 23:18   ECHOCARDIOGRAM COMPLETE  Result Date: 04/25/2020    ECHOCARDIOGRAM REPORT   Patient Name:   TJOHNATHON MITTALDate of Exam: 04/25/2020 Medical Rec #:  [id number]      Height:       58.0 in Accession #:    [id number]     Weight:       84.0 lb Date of Birth:  1Aug 14, 1997     BSA:          1.260 m Patient Age:    23 years        BP:           107/87 mmHg Patient Gender: F               HR:           81 bpm. Exam Location:  Inpatient Procedure: 2D Echo Indications:   stroke 434.91  History:       Patient has no prior history of Echocardiogram examinations.                Lupus.  Sonographer:   VJannett CelestineRDCS (AE) Referring      4[place]inetopsPhys:  Sonographer Comments: petite stature made some images challenging IMPRESSIONS  1. Left ventricular ejection fraction, by  estimation, is 60 to 65%. The left ventricle has normal function. The left ventricle has no regional wall motion abnormalities. Left ventricular diastolic parameters were normal.  2. Right ventricular systolic function is normal. The right ventricular  size is normal. Tricuspid regurgitation signal is inadequate for assessing PA pressure.  3. The mitral valve is grossly normal. No evidence of mitral valve regurgitation. No evidence of mitral stenosis.  4. The aortic valve is tricuspid. Aortic valve regurgitation is not visualized. No aortic stenosis is present.  5. The inferior vena cava is normal in size with greater than 50% respiratory variability, suggesting right atrial pressure of 3 mmHg. Conclusion(s)/Recommendation(s): Normal biventricular function without evidence of hemodynamically significant valvular heart disease. No intracardiac source of embolism detected on this transthoracic study. A transesophageal echocardiogram is recommended to exclude cardiac source of embolism if clinically indicated. FINDINGS  Left Ventricle: Left ventricular ejection fraction, by estimation, is 60 to 65%. The left ventricle has normal function. The left ventricle has no regional wall motion abnormalities. The left ventricular internal cavity size was normal in size. There is  no left ventricular hypertrophy. Left ventricular diastolic parameters were normal. Right Ventricle: The right ventricular size is normal. No increase in right ventricular wall thickness. Right ventricular systolic function is normal. Tricuspid regurgitation signal is inadequate for assessing PA pressure. Left Atrium: Left atrial size was normal in size. Right Atrium: Right atrial size was normal in size. Pericardium: Trivial pericardial effusion is present. Mitral Valve: The mitral valve is grossly normal. No evidence of mitral valve regurgitation. No evidence of mitral valve stenosis. Tricuspid Valve: The tricuspid valve is grossly normal. Tricuspid valve regurgitation is not demonstrated. No evidence of tricuspid stenosis. Aortic Valve: The aortic valve is tricuspid. Aortic valve regurgitation is not visualized. No aortic stenosis is present. Pulmonic Valve: The pulmonic valve was grossly normal.  Pulmonic valve regurgitation is not visualized. No evidence of pulmonic stenosis. Aorta: The aortic root is normal in size and structure. Venous: The right upper pulmonary vein is normal. The inferior vena cava is normal in size with greater than 50% respiratory variability, suggesting right atrial pressure of 3 mmHg. IAS/Shunts: The atrial septum is grossly normal.  LEFT VENTRICLE PLAX 2D LVIDd:         3.40 cm  Diastology LVIDs:         2.60 cm  LV e' lateral:   11.30 cm/s LV PW:         0.60 cm  LV E/e' lateral: 9.6 LV IVS:        0.60 cm  LV e' medial:    13.70 cm/s LVOT diam:     1.60 cm  LV E/e' medial:  7.9 LV SV:         39 LV SV Index:   31 LVOT Area:     2.01 cm  LEFT ATRIUM           Index       RIGHT ATRIUM          Index LA diam:      2.20 cm 1.75 cm/m  RA Area:     9.50 cm LA Vol (A4C): 15.8 ml 12.54 ml/m RA Volume:   18.40 ml 14.61 ml/m  AORTIC VALVE LVOT Vmax:   105.00 cm/s LVOT Vmean:  74.400 cm/s LVOT VTI:    0.196 m  AORTA Ao Root diam: 2.20 cm MITRAL VALVE MV Area (PHT): 3.99 cm     SHUNTS MV Decel Time: 190 msec  Systemic VTI:  0.20 m MV E velocity: 108.00 cm/s  Systemic Diam: 1.60 cm Eleonore Chiquito MD Electronically signed by Eleonore Chiquito MD Signature Date/Time: 04/25/2020/12:14:12 PM    Final    IR ANGIO INTRA EXTRACRAN SEL INTERNAL CAROTID BILAT MOD SED  Result Date: 04/27/2020 INDICATION: 24 year old female with a past medical history significant for ITP and lupus. She presented to [facility] ED 04/24/2020 with complaints of left-sided numbness and headache. In ED, CT head revealed a medium-sized mass-like lesion in the right parietal lobe with a large amount of surrounding edema. MRI showed prominent susceptibility artifact associated with the area of edema, concerning for a subacute hemorrhage. She was transferred and admitted to [facility] for further management. She comes to our service to the right diagnostic cerebral angiogram to evaluate for possible vascular cause of hemorrhage. EXAM:  ULTRASOUND-GUIDED VASCULAR ACCESS DIAGNOSTIC CEREBRAL ANGIOGRAM COMPARISON:  MR angiogram April 24, 2020 MEDICATIONS: No antibiotics administered. ANESTHESIA/SEDATION: Versed 1 mg IV; Fentanyl 25 mcg IV Moderate Sedation Time:  44 minutes The patient was continuously monitored during the procedure by the interventional radiology nurse under my direct supervision. FLUOROSCOPY TIME:  Fluoroscopy Time: 8 minutes 42 seconds (274.5 mGy). COMPLICATIONS: None immediate. TECHNIQUE: Informed written consent was obtained from the patient after a thorough discussion of the procedural risks, benefits and alternatives. All questions were addressed. Maximal Sterile Barrier Technique was utilized including caps, mask, sterile gowns, sterile gloves, sterile drape, hand hygiene and skin antiseptic. A timeout was performed prior to the initiation of the procedure. The right hand and wrist were prepped and draped in the usual sterile fashion. Real-time ultrasound guidance was utilized for vascular access including the acquisition of a permanent ultrasound image documenting patency of the accessed vessel. Using the modified Seldinger technique and a micropuncture kit, access was gained to the distal right radial artery at the anatomical snuffbox and a 5 French sheath was placed. Slow intra arterial infusion of 5,000 IU heparin, 5 mg Verapamil and 291 mcg nitroglicerin diluted in patient's own blood was performed. No significant fluctuation in patient's blood pressure seen. Then, a right radial artery roadmap was obtained via sheath side port. Normal brachial artery branching pattern seen. No significant anatomical variation. The right radial artery caliber is adequate for vascular access. Next, a 5 [place] Welborn 2 glide catheter was navigated over a 0.035" Terumo Glidewire into the right subclavian artery under fluoroscopic guidance. A subclavian artery roadmap was obtained. The catheter was then navigated under roadmap guidance into  the right vertebral artery. A right vertebral artery angiogram with Townes and lateral views of the head were obtained. The catheter was then placed into the left common carotid artery. Frontal and lateral angiograms of the neck were obtained. The catheter was subsequently advanced into the left internal carotid artery under biplane roadmap. Frontal, lateral and magnified waters and Schller's view of the head were obtained. The catheter was retracted into the right common carotid artery and then advanced under biplane roadmap into the right external carotid artery. Frontal and lateral angiograms of the head were obtained. The catheter was subsequently withdrawn. An inflatable band was placed and inflated over the right hand access site. The vascular sheath was withdrawn and the band was slowly deflated until brisk flow was noted through the arteriotomy site. At this point, the band was reinflated with additional 2 cc of air to obtain patent hemostasis. FINDINGS: Right radial artery ultrasound and right radial artery angiogram: The caliber of the distal right radial artery is appropriate for  angiogram access. The right radial artery and the right ulnar artery have normal course and caliber. No significant anatomical variants noted. Right vertebral artery angiograms: The right vertebral artery, basilar artery, and bilateral posterior cerebral arteries are unremarkable. The left vertebral artery seen by contrast reflux in appear unremarkable. Luminal caliber is smooth and tapering. No aneurysms or abnormally high-flow, early draining veins are seen. No regions of abnormal hypervascularity are noted. The visualized dural sinuses are patent. Left CCA angiograms: Cervical angiograms show normal course and caliber of the visualized left common carotid and internal carotid arteries. There are no significant stenoses. Left ICA angiograms: There is brisk vascular contrast filling of the left ACA and MCA vascular trees. The  right ACA vascular tree is opacified via a prominent anterior communicating artery. Incidental note made of fenestration of the proximal left A2/ACA. Luminal caliber is smooth and tapering. No aneurysms or abnormally high-flow, early draining veins are seen. No regions of abnormal hypervascularity are noted. The visualized dural sinuses are patent. Right CCA angiograms: Cervical angiograms show normal course and caliber of the visualized right common carotid and internal carotid arteries. There are no significant stenoses. Right ICA angiograms: There is brisk vascular contrast filling of the right ACA and MCA vascular trees. The left ACA vascular trees opacify via a prominent anterior communicating artery. Luminal caliber is smooth and tapering. No aneurysms or abnormally high-flow, early draining veins are seen. No regions of abnormal hypervascularity are noted. The visualized dural sinuses are patent. Right ECA and right occipital angiograms: No early venous drainage was noted. The cranial branches of the right external carotid artery are unremarkable. PROCEDURE: No intervention performed. IMPRESSION: Unremarkable diagnostic cerebral angiogram. Specifically, no evidence of an AVM, dural AV fistula or other vascular abnormality that could explain patient's known intraparenchymal hemorrhage. PLAN: Per Neurology. Electronically Signed   By: Pedro Earls M.D.   On: 04/27/2020 13:48   IR ANGIO VERTEBRAL SEL VERTEBRAL UNI R MOD SED  Result Date: 04/27/2020 INDICATION: 24 year old female with a past medical history significant for ITP and lupus. She presented to [facility] ED 04/24/2020 with complaints of left-sided numbness and headache. In ED, CT head revealed a medium-sized mass-like lesion in the right parietal lobe with a large amount of surrounding edema. MRI showed prominent susceptibility artifact associated with the area of edema, concerning for a subacute hemorrhage. She was transferred and  admitted to [facility] for further management. She comes to our service to the right diagnostic cerebral angiogram to evaluate for possible vascular cause of hemorrhage. EXAM: ULTRASOUND-GUIDED VASCULAR ACCESS DIAGNOSTIC CEREBRAL ANGIOGRAM COMPARISON:  MR angiogram April 24, 2020 MEDICATIONS: No antibiotics administered. ANESTHESIA/SEDATION: Versed 1 mg IV; Fentanyl 25 mcg IV Moderate Sedation Time:  44 minutes The patient was continuously monitored during the procedure by the interventional radiology nurse under my direct supervision. FLUOROSCOPY TIME:  Fluoroscopy Time: 8 minutes 42 seconds (274.5 mGy). COMPLICATIONS: None immediate. TECHNIQUE: Informed written consent was obtained from the patient after a thorough discussion of the procedural risks, benefits and alternatives. All questions were addressed. Maximal Sterile Barrier Technique was utilized including caps, mask, sterile gowns, sterile gloves, sterile drape, hand hygiene and skin antiseptic. A timeout was performed prior to the initiation of the procedure. The right hand and wrist were prepped and draped in the usual sterile fashion. Real-time ultrasound guidance was utilized for vascular access including the acquisition of a permanent ultrasound image documenting patency of the accessed vessel. Using the modified Seldinger technique and a micropuncture kit, access  was gained to the distal right radial artery at the anatomical snuffbox and a 5 French sheath was placed. Slow intra arterial infusion of 5,000 IU heparin, 5 mg Verapamil and 983 mcg nitroglicerin diluted in patient's own blood was performed. No significant fluctuation in patient's blood pressure seen. Then, a right radial artery roadmap was obtained via sheath side port. Normal brachial artery branching pattern seen. No significant anatomical variation. The right radial artery caliber is adequate for vascular access. Next, a 5 [place] Evitt 2 glide catheter was navigated over a 0.035" Terumo  Glidewire into the right subclavian artery under fluoroscopic guidance. A subclavian artery roadmap was obtained. The catheter was then navigated under roadmap guidance into the right vertebral artery. A right vertebral artery angiogram with Townes and lateral views of the head were obtained. The catheter was then placed into the left common carotid artery. Frontal and lateral angiograms of the neck were obtained. The catheter was subsequently advanced into the left internal carotid artery under biplane roadmap. Frontal, lateral and magnified waters and Schller's view of the head were obtained. The catheter was retracted into the right common carotid artery and then advanced under biplane roadmap into the right external carotid artery. Frontal and lateral angiograms of the head were obtained. The catheter was subsequently withdrawn. An inflatable band was placed and inflated over the right hand access site. The vascular sheath was withdrawn and the band was slowly deflated until brisk flow was noted through the arteriotomy site. At this point, the band was reinflated with additional 2 cc of air to obtain patent hemostasis. FINDINGS: Right radial artery ultrasound and right radial artery angiogram: The caliber of the distal right radial artery is appropriate for angiogram access. The right radial artery and the right ulnar artery have normal course and caliber. No significant anatomical variants noted. Right vertebral artery angiograms: The right vertebral artery, basilar artery, and bilateral posterior cerebral arteries are unremarkable. The left vertebral artery seen by contrast reflux in appear unremarkable. Luminal caliber is smooth and tapering. No aneurysms or abnormally high-flow, early draining veins are seen. No regions of abnormal hypervascularity are noted. The visualized dural sinuses are patent. Left CCA angiograms: Cervical angiograms show normal course and caliber of the visualized left common carotid  and internal carotid arteries. There are no significant stenoses. Left ICA angiograms: There is brisk vascular contrast filling of the left ACA and MCA vascular trees. The right ACA vascular tree is opacified via a prominent anterior communicating artery. Incidental note made of fenestration of the proximal left A2/ACA. Luminal caliber is smooth and tapering. No aneurysms or abnormally high-flow, early draining veins are seen. No regions of abnormal hypervascularity are noted. The visualized dural sinuses are patent. Right CCA angiograms: Cervical angiograms show normal course and caliber of the visualized right common carotid and internal carotid arteries. There are no significant stenoses. Right ICA angiograms: There is brisk vascular contrast filling of the right ACA and MCA vascular trees. The left ACA vascular trees opacify via a prominent anterior communicating artery. Luminal caliber is smooth and tapering. No aneurysms or abnormally high-flow, early draining veins are seen. No regions of abnormal hypervascularity are noted. The visualized dural sinuses are patent. Right ECA and right occipital angiograms: No early venous drainage was noted. The cranial branches of the right external carotid artery are unremarkable. PROCEDURE: No intervention performed. IMPRESSION: Unremarkable diagnostic cerebral angiogram. Specifically, no evidence of an AVM, dural AV fistula or other vascular abnormality that could explain patient's known intraparenchymal  hemorrhage. PLAN: Per Neurology. Electronically Signed   By: Pedro Earls M.D.   On: 04/27/2020 13:48   IR ANGIO EXTERNAL CAROTID SEL EXT CAROTID UNI R MOD SED  Result Date: 04/27/2020 INDICATION: 24 year old female with a past medical history significant for ITP and lupus. She presented to [facility] ED 04/24/2020 with complaints of left-sided numbness and headache. In ED, CT head revealed a medium-sized mass-like lesion in the right parietal lobe with a  large amount of surrounding edema. MRI showed prominent susceptibility artifact associated with the area of edema, concerning for a subacute hemorrhage. She was transferred and admitted to [facility] for further management. She comes to our service to the right diagnostic cerebral angiogram to evaluate for possible vascular cause of hemorrhage. EXAM: ULTRASOUND-GUIDED VASCULAR ACCESS DIAGNOSTIC CEREBRAL ANGIOGRAM COMPARISON:  MR angiogram April 24, 2020 MEDICATIONS: No antibiotics administered. ANESTHESIA/SEDATION: Versed 1 mg IV; Fentanyl 25 mcg IV Moderate Sedation Time:  44 minutes The patient was continuously monitored during the procedure by the interventional radiology nurse under my direct supervision. FLUOROSCOPY TIME:  Fluoroscopy Time: 8 minutes 42 seconds (274.5 mGy). COMPLICATIONS: None immediate. TECHNIQUE: Informed written consent was obtained from the patient after a thorough discussion of the procedural risks, benefits and alternatives. All questions were addressed. Maximal Sterile Barrier Technique was utilized including caps, mask, sterile gowns, sterile gloves, sterile drape, hand hygiene and skin antiseptic. A timeout was performed prior to the initiation of the procedure. The right hand and wrist were prepped and draped in the usual sterile fashion. Real-time ultrasound guidance was utilized for vascular access including the acquisition of a permanent ultrasound image documenting patency of the accessed vessel. Using the modified Seldinger technique and a micropuncture kit, access was gained to the distal right radial artery at the anatomical snuffbox and a 5 French sheath was placed. Slow intra arterial infusion of 5,000 IU heparin, 5 mg Verapamil and 818 mcg nitroglicerin diluted in patient's own blood was performed. No significant fluctuation in patient's blood pressure seen. Then, a right radial artery roadmap was obtained via sheath side port. Normal brachial artery branching pattern seen. No  significant anatomical variation. The right radial artery caliber is adequate for vascular access. Next, a 5 [place] Constantino 2 glide catheter was navigated over a 0.035" Terumo Glidewire into the right subclavian artery under fluoroscopic guidance. A subclavian artery roadmap was obtained. The catheter was then navigated under roadmap guidance into the right vertebral artery. A right vertebral artery angiogram with Townes and lateral views of the head were obtained. The catheter was then placed into the left common carotid artery. Frontal and lateral angiograms of the neck were obtained. The catheter was subsequently advanced into the left internal carotid artery under biplane roadmap. Frontal, lateral and magnified waters and Schller's view of the head were obtained. The catheter was retracted into the right common carotid artery and then advanced under biplane roadmap into the right external carotid artery. Frontal and lateral angiograms of the head were obtained. The catheter was subsequently withdrawn. An inflatable band was placed and inflated over the right hand access site. The vascular sheath was withdrawn and the band was slowly deflated until brisk flow was noted through the arteriotomy site. At this point, the band was reinflated with additional 2 cc of air to obtain patent hemostasis. FINDINGS: Right radial artery ultrasound and right radial artery angiogram: The caliber of the distal right radial artery is appropriate for angiogram access. The right radial artery and the right ulnar artery have  normal course and caliber. No significant anatomical variants noted. Right vertebral artery angiograms: The right vertebral artery, basilar artery, and bilateral posterior cerebral arteries are unremarkable. The left vertebral artery seen by contrast reflux in appear unremarkable. Luminal caliber is smooth and tapering. No aneurysms or abnormally high-flow, early draining veins are seen. No regions of abnormal  hypervascularity are noted. The visualized dural sinuses are patent. Left CCA angiograms: Cervical angiograms show normal course and caliber of the visualized left common carotid and internal carotid arteries. There are no significant stenoses. Left ICA angiograms: There is brisk vascular contrast filling of the left ACA and MCA vascular trees. The right ACA vascular tree is opacified via a prominent anterior communicating artery. Incidental note made of fenestration of the proximal left A2/ACA. Luminal caliber is smooth and tapering. No aneurysms or abnormally high-flow, early draining veins are seen. No regions of abnormal hypervascularity are noted. The visualized dural sinuses are patent. Right CCA angiograms: Cervical angiograms show normal course and caliber of the visualized right common carotid and internal carotid arteries. There are no significant stenoses. Right ICA angiograms: There is brisk vascular contrast filling of the right ACA and MCA vascular trees. The left ACA vascular trees opacify via a prominent anterior communicating artery. Luminal caliber is smooth and tapering. No aneurysms or abnormally high-flow, early draining veins are seen. No regions of abnormal hypervascularity are noted. The visualized dural sinuses are patent. Right ECA and right occipital angiograms: No early venous drainage was noted. The cranial branches of the right external carotid artery are unremarkable. PROCEDURE: No intervention performed. IMPRESSION: Unremarkable diagnostic cerebral angiogram. Specifically, no evidence of an AVM, dural AV fistula or other vascular abnormality that could explain patient's known intraparenchymal hemorrhage. PLAN: Per Neurology. Electronically Signed   By: Pedro Earls M.D.   On: 04/27/2020 13:48      Procedures: cerebral angiogram.   Subjective: Patient is feeling better, her headache has improved, no nausea or vomiting, no chest pain or dyspnea.   Discharge  Exam: Vitals:   04/28/20 0404 04/28/20 0741  BP: 112/79 106/69  Pulse: 88 83  Resp: 15 18  Temp: 98.3 F (36.8 C) 98 F (36.7 C)  SpO2: 98% 100%   Vitals:   04/27/20 1930 04/27/20 2337 04/28/20 0404 04/28/20 0741  BP: 107/77 109/90 112/79 106/69  Pulse: 75 89 88 83  Resp: 18 18 15 18   Temp: 98.1 F (36.7 C) 98.1 F (36.7 C) 98.3 F (36.8 C) 98 F (36.7 C)  TempSrc: Oral Oral Oral Oral  SpO2: 100% 99% 98% 100%  Weight:      Height:        General: Not in pain or dyspnea  Neurology: Awake and alert, non focal  E ENT: no pallor, no icterus, oral mucosa moist Cardiovascular: No JVD. S1-S2 present, rhythmic, no gallops, rubs, or murmurs. No lower extremity edema. Pulmonary: positive breath sounds bilaterally, adequate air movement, no wheezing, rhonchi or rales. Gastrointestinal. Abdomen soft and non tender Skin. No rashes Musculoskeletal: no joint deformities   The results of significant diagnostics from this hospitalization (including imaging, microbiology, ancillary and laboratory) are listed below for reference.     Microbiology: Recent Results (from the past 240 hour(s))  SARS Coronavirus 2 by RT PCR (hospital order, performed in [facility] hospital lab) Nasopharyngeal Nasopharyngeal Swab     Status: None   Collection Time: 04/24/20  4:39 PM   Specimen: Nasopharyngeal Swab  Result Value Ref Range Status   SARS  Coronavirus 2 NEGATIVE NEGATIVE Final    Comment: (NOTE) SARS-CoV-2 target nucleic acids are NOT DETECTED.  The SARS-CoV-2 RNA is generally detectable in upper and lower respiratory specimens during the acute phase of infection. The lowest concentration of SARS-CoV-2 viral copies this assay can detect is 250 copies / mL. A negative result does not preclude SARS-CoV-2 infection and should not be used as the sole basis for treatment or other patient management decisions.  A negative result may occur with improper specimen collection / handling, submission  of specimen other than nasopharyngeal swab, presence of viral mutation(s) within the areas targeted by this assay, and inadequate number of viral copies (<250 copies / mL). A negative result must be combined with clinical observations, patient history, and epidemiological information.  Fact Sheet for Patients:   StrictlyIdeas.no  Fact Sheet for Healthcare Providers: BankingDealers.co.za  This test is not yet approved or  cleared by the [place] FDA and has been authorized for detection and/or diagnosis of SARS-CoV-2 by FDA under an Emergency Use Authorization (EUA).  This EUA will remain in effect (meaning this test can be used) for the duration of the COVID-19 declaration under Section 564(b)(1) of the Act, 21 U.S.C. section 360bbb-3(b)(1), unless the authorization is terminated or revoked sooner.  Performed at [facility], [place] [address], [place], [place] [address]   MRSA PCR Screening     Status: None   Collection Time: 04/24/20  8:25 PM   Specimen: Nasal Mucosa; Nasopharyngeal  Result Value Ref Range Status   MRSA by PCR NEGATIVE NEGATIVE Final    Comment:        The GeneXpert MRSA Assay (FDA approved for NASAL specimens only), is one component of a comprehensive MRSA colonization surveillance program. It is not intended to diagnose MRSA infection nor to guide or monitor treatment for MRSA infections. Performed at [place] [facility], [place] [address] [place],  [address]      Labs: BNP (last 3 results) No results for input(s): BNP in the last 8760 hours. Basic Metabolic Panel: Recent Labs  Lab 04/24/20 1144  NA 136  K 3.5  CL 103  CO2 24  GLUCOSE 120*  BUN 10  CREATININE 0.72  CALCIUM 9.2   Liver Function Tests: Recent Labs  Lab 04/24/20 1144  AST 20  ALT 14  ALKPHOS 58  BILITOT <0.1*  PROT 9.7*  ALBUMIN 4.4   No results for input(s): LIPASE, AMYLASE in the last 168  hours. No results for input(s): AMMONIA in the last 168 hours. CBC: Recent Labs  Lab 04/24/20 1144 04/26/20 0952  WBC 8.5 5.2  NEUTROABS 6.1 2.9  HGB 9.3* 8.8*  HCT 33.7* 32.4*  MCV 65.1* 64.3*  PLT 461* 404*   Cardiac Enzymes: No results for input(s): CKTOTAL, CKMB, CKMBINDEX, TROPONINI in the last 168 hours. BNP: Invalid input(s): POCBNP CBG: Recent Labs  Lab 04/24/20 1154  GLUCAP 106*   D-Dimer No results for input(s): DDIMER in the last 72 hours. Hgb A1c No results for input(s): HGBA1C in the last 72 hours. Lipid Profile No results for input(s): CHOL, HDL, LDLCALC, TRIG, CHOLHDL, LDLDIRECT in the last 72 hours. Thyroid function studies No results for input(s): TSH, T4TOTAL, T3FREE, THYROIDAB in the last 72 hours.  Invalid input(s): FREET3 Anemia work up Recent Labs    04/27/20 1143  FERRITIN 5*  TIBC 403  IRON 33   Urinalysis    Component Value Date/Time   COLORURINE YELLOW 04/24/2020 1221   APPEARANCEUR  CLEAR 04/24/2020 1221   LABSPEC 1.027 04/24/2020 1221   PHURINE 5.0 04/24/2020 1221   GLUCOSEU NEGATIVE 04/24/2020 1221   HGBUR SMALL (A) 04/24/2020 1221   BILIRUBINUR NEGATIVE 04/24/2020 1221   KETONESUR 5 (A) 04/24/2020 1221   PROTEINUR NEGATIVE 04/24/2020 1221   NITRITE NEGATIVE 04/24/2020 1221   LEUKOCYTESUR NEGATIVE 04/24/2020 1221   Sepsis Labs Invalid input(s): PROCALCITONIN,  WBC,  LACTICIDVEN Microbiology Recent Results (from the past 240 hour(s))  SARS Coronavirus 2 by RT PCR (hospital order, performed in [facility] hospital lab) Nasopharyngeal Nasopharyngeal Swab     Status: None   Collection Time: 04/24/20  4:39 PM   Specimen: Nasopharyngeal Swab  Result Value Ref Range Status   SARS Coronavirus 2 NEGATIVE NEGATIVE Final    Comment: (NOTE) SARS-CoV-2 target nucleic acids are NOT DETECTED.  The SARS-CoV-2 RNA is generally detectable in upper and lower respiratory specimens during the acute phase of infection. The  lowest concentration of SARS-CoV-2 viral copies this assay can detect is 250 copies / mL. A negative result does not preclude SARS-CoV-2 infection and should not be used as the sole basis for treatment or other patient management decisions.  A negative result may occur with improper specimen collection / handling, submission of specimen other than nasopharyngeal swab, presence of viral mutation(s) within the areas targeted by this assay, and inadequate number of viral copies (<250 copies / mL). A negative result must be combined with clinical observations, patient history, and epidemiological information.  Fact Sheet for Patients:   StrictlyIdeas.no  Fact Sheet for Healthcare Providers: BankingDealers.co.za  This test is not yet approved or  cleared by the [place] FDA and has been authorized for detection and/or diagnosis of SARS-CoV-2 by FDA under an Emergency Use Authorization (EUA).  This EUA will remain in effect (meaning this test can be used) for the duration of the COVID-19 declaration under Section 564(b)(1) of the Act, 21 U.S.C. section 360bbb-3(b)(1), unless the authorization is terminated or revoked sooner.  Performed at [facility], [place] [address], [place], [place] [address]   MRSA PCR Screening     Status: None   Collection Time: 04/24/20  8:25 PM   Specimen: Nasal Mucosa; Nasopharyngeal  Result Value Ref Range Status   MRSA by PCR NEGATIVE NEGATIVE Final    Comment:        The GeneXpert MRSA Assay (FDA approved for NASAL specimens only), is one component of a comprehensive MRSA colonization surveillance program. It is not intended to diagnose MRSA infection nor to guide or monitor treatment for MRSA infections. Performed at [facility], [place] [address], [place], [place] [address]      Time coordinating discharge: 45 minutes  SIGNED:   Tawni Millers, MD  [facility] 04/28/2020, 8:38 AM

## 2020-04-29 ENCOUNTER — Telehealth: Payer: Self-pay | Admitting: Hematology and Oncology

## 2020-04-29 ENCOUNTER — Inpatient Hospital Stay (HOSPITAL_BASED_OUTPATIENT_CLINIC_OR_DEPARTMENT_OTHER): Payer: BC Managed Care – PPO | Admitting: Hematology and Oncology

## 2020-04-29 DIAGNOSIS — I611 Nontraumatic intracerebral hemorrhage in hemisphere, cortical: Secondary | ICD-10-CM | POA: Diagnosis not present

## 2020-04-29 LAB — FACTOR 12 ASSAY: Factor XII Activity: 89 % (ref 50–150)

## 2020-04-29 LAB — FACTOR 11 ASSAY: Factor XI Activity: 83 % (ref 60–150)

## 2020-04-29 LAB — FACTOR 9 ASSAY: Coagulation Factor IX: 103 % (ref 60–177)

## 2020-04-29 LAB — FACTOR 8 ASSAY: Coagulation Factor VIII: 192 % — ABNORMAL HIGH (ref 56–140)

## 2020-04-29 MED ORDER — POLYSACCHARIDE IRON COMPLEX 150 MG PO CAPS
150.0000 mg | ORAL_CAPSULE | Freq: Two times a day (BID) | ORAL | 3 refills | Status: DC
Start: 2020-04-29 — End: 2020-07-02

## 2020-04-29 NOTE — Assessment & Plan Note (Addendum)
  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 She had a bone marrow biopsy there and was treated with IVIG prednisone and rituximab. 2.Frequent relapses every 6 months. She required IVIG prednisone and Rituxan. 3.Moved to [place] and relapsed 07/06/2015, 02/27/2016 at [facility] 02/27/2016: 4 days of Decadron and IVIG and hydroxyurea for lupus(platelet count of 12)followed by Rituxan 07/06/2015: Hospitalization for ITP: Steroids with IVIG 4.Patient investigated and found dapsone 100 mg daily which has been preventing relapses of ITP. 5.10/24/2018: Platelets 49 when she ran out of dapsone. 6.Relapse 01/27/2019: Dexamethasone went into remission 7.05/17/2019: Relapse platelet count 17 8. Dexamethasone4 mg dailywith Promacta 50 mg daily  ------------------------------------------------------------------------------------------------------------------------------------------------------ Hospitalization: 04/24/20-04/28/20: Intra cerebral hemorrhage (platelets > 400K), No surg intervention Angiograms: No aneurysms No clear cut etiology. PTT was prolonged (workup Negative; Factors 8,9,11 and 12 were normal) Iron def anemia: serum iron 33, transferrin saturation 8, ferritin 5, TIBC 403. received 1 dose of IV Iron Offered additional IV Iron treatments Vs oral iron replacement therapy.  ITP: Remission, continue same Rx  Lupus: on hydroxychloroquine

## 2020-04-29 NOTE — Telephone Encounter (Signed)
I discussed with the patient about her recent hospitalization and all the lab tests that she had done. Intracerebral hemorrhage is of unclear etiology.  Hospitalization: 04/24/20-04/28/20: Intra cerebral hemorrhage (platelets > 400K), No surg intervention Angiograms: No aneurysms No clear cut etiology. PTT was prolonged (workup Negative; Factors 8,9,11 and 12 were normal) Iron def anemia: serum iron 33, transferrin saturation 8, ferritin 5, TIBC 403. received 1 dose of IV Iron Offered additional IV Iron treatments Vs oral iron replacement therapy. Because she is at school, I sent prescription for Niferex capsules to her pharmacy.  I will send a message to Dr. Tawanna Cooler back to recheck her labs in 2 months and inform me of her platelet count and her iron studies.  ITP: Remission, continue same Rx  Lupus: on hydroxychloroquine

## 2020-05-01 MED FILL — PROMACTA 50 MG TABLET: 50 | 30 days supply | Qty: 30 | Fill #0

## 2020-05-06 ENCOUNTER — Ambulatory Visit: Payer: BC Managed Care – PPO | Attending: Internal Medicine

## 2020-05-06 ENCOUNTER — Other Ambulatory Visit: Payer: Self-pay

## 2020-05-06 VITALS — BP 96/70 | HR 95

## 2020-05-06 DIAGNOSIS — R208 Other disturbances of skin sensation: Secondary | ICD-10-CM

## 2020-05-06 DIAGNOSIS — R209 Unspecified disturbances of skin sensation: Secondary | ICD-10-CM | POA: Insufficient documentation

## 2020-05-06 DIAGNOSIS — M6281 Muscle weakness (generalized): Secondary | ICD-10-CM | POA: Diagnosis not present

## 2020-05-06 NOTE — Therapy (Signed)
[facility] [address] [place], [place], [address] Phone: [phone]   Fax:  [phone]  Physical Therapy Evaluation  Patient Details  Name: Rebecca Bright MRN: [id number] Date of Birth: 03-Feb-1996 Referring Provider (PT): Referred by: Coralie Keens, MD followed by Delia Heady, MD   Encounter Date: 05/06/2020   PT End of Session - 05/06/20 0928    Visit Number 1    Number of Visits 1    Authorization Type BCBS    PT Start Time 0847    PT Stop Time 0927    PT Time Calculation (min) 40 min    Activity Tolerance Patient tolerated treatment well    Behavior During Therapy [facility] for tasks assessed/performed           Past Medical History:  Diagnosis Date   History of ITP    Lupus (HCC)     Past Surgical History:  Procedure Laterality Date   HERNIA REPAIR     IR ANGIO EXTERNAL CAROTID SEL EXT CAROTID UNI R MOD SED  04/27/2020   IR ANGIO INTRA EXTRACRAN SEL INTERNAL CAROTID BILAT MOD SED  04/27/2020   IR ANGIO VERTEBRAL SEL VERTEBRAL UNI R MOD SED  04/27/2020   IR US GUIDE VASC ACCESS RIGHT  04/27/2020    Vitals:   05/06/20 0904  BP: 96/70  Pulse: 95      Subjective Assessment - 05/06/20 0852    Subjective Patient was admitted to ED on 8/13 with left sided weakness and headache. Patient reports started running into things on her left side. Patient reports still having minor headache daily, but has improved. Patient reports that she feels that she has to think harder about tasks now. No pain to report, just headache. Feels like she is using the R Leg More    Patient is accompained by: Family member   Mom   Pertinent History Iron Deficiency Anemia, Systemic Lupus Erythematous    Limitations Walking;Standing    How long can you walk comfortably? 15-20 minutes    Currently in Pain? No/denies              [facility] PT Assessment - 05/06/20 0001      Assessment   Medical Diagnosis  Intracerebral Hemorrage    Referring Provider (PT) Referred by: Coralie Keens, MD followed by Delia Heady, MD    Onset Date/Surgical Date 04/24/20    Hand Dominance Right    Prior Therapy Outpatient PT (for ankle/knee pain)       Precautions   Precautions None      Balance Screen   Has the patient fallen in the past 6 months No    Has the patient had a decrease in activity level because of a fear of falling?  Yes    Is the patient reluctant to leave their home because of a fear of falling?  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 at [employer]    Leisure watching tv      Cognition   Overall Cognitive Status Within Functional Limits for tasks assessed      Sensation   Light Touch Impaired by gross assessment    Proprioception Appears Intact    Additional Comments slightly dimished sensation on LLE      Coordination   Gross Motor Movements are Fluid and Coordinated Yes      ROM / Strength   AROM / PROM / Strength AROM;Strength      AROM   Overall AROM  Within functional limits for tasks performed    Overall AROM Comments for BLE's      Strength   Overall Strength Within functional limits for tasks performed    Strength Assessment Site Hip;Knee;Ankle    Right/Left Hip Right;Left    Right Hip Flexion 4+/5    Right Hip ABduction 4+/5    Right Hip ADduction 4+/5    Left Hip Flexion 4-/5    Left Hip ABduction 4+/5    Left Hip ADduction 4+/5     Right/Left Knee Right;Left    Right Knee Flexion 5/5    Right Knee Extension 5/5    Left Knee Flexion 5/5    Left Knee Extension 5/5    Right/Left Ankle Right;Left    Right Ankle Dorsiflexion 4+/5    Left Ankle Dorsiflexion 4+/5      Transfers   Transfers Sit to Stand;Stand to Sit    Sit to Stand 7: Independent    Stand to Sit 7: Independent    Comments Completed 30 second chair stand test without UE support: 17 sit <> stands. no reports of fatigue/pain with completion      Ambulation/Gait   Ambulation/Gait Yes    Ambulation/Gait Assistance 7: Independent    Ambulation Distance (Feet) 115 Feet    Assistive device None    Gait Pattern Within Functional Limits    Ambulation Surface Level;Indoor    Gait velocity 8.07 secs = 4.06 ft/sec    Stairs Yes    Stairs Assistance 7: Independent    Stair Management Technique No rails;Alternating pattern;Forwards    Number of Stairs 8    Height of Stairs 6      High Level Balance   High Level Balance Comments Completed M-CSTIB, with patient able to complete all 4 situations for full 30 seconds. no instances of instability noted, normal sway.       Functional Gait  Assessment   Gait assessed  Yes    Gait Level Surface Walks 20 ft in less than 5.5 sec, no assistive devices, good speed, no evidence for imbalance, normal gait pattern, deviates no more than 6 in outside of the 12 in walkway width.    Change in Gait Speed Able to smoothly change walking speed without loss of balance or gait deviation. Deviate no more than 6 in outside of the 12 in walkway width.    Gait with Horizontal Head Turns Performs head turns smoothly with slight change in gait velocity (eg, minor disruption to smooth gait path), deviates 6-10 in outside 12 in walkway width, or uses an assistive device.    Gait with Vertical Head Turns Performs head turns with no change in gait. Deviates no more than 6 in outside 12 in walkway width.    Gait and Pivot Turn Pivot turns safely  within 3 sec  and stops quickly with no loss of balance.    Step Over Obstacle Is able to step over 2 stacked shoe boxes taped together (9 in total height) without changing gait speed. No evidence of imbalance.    Gait with Narrow Base of Support Is able to ambulate for 10 steps heel to toe with no staggering.    Gait with Eyes Closed Walks 20 ft, no assistive devices, good speed, no evidence of imbalance, normal gait pattern, deviates no more than 6 in outside 12 in walkway width. Ambulates 20 ft in less than 7 sec.    Ambulating Backwards Walks 20 ft, no assistive devices, good speed, no evidence for imbalance, normal gait    Steps Alternating feet, no rail.    Total Score 29    FGA comment: 29/30 = Low Fall Risk                      Objective measurements completed on examination: See above findings.               PT Education - 05/06/20 0928    Education Details Educated on Evaluation Findings; Benefits of Aerobic Activity/Walking Program    Person(s) Educated Patient;Parent(s)    Methods Explanation    Comprehension Verbalized understanding                       Plan - 05/06/20 1120    Clinical Impression Statement Patient is a 24 y.o. female that was referred to Neuro OPPT services due to Intracranial Hemorrhage of unclear etiology. Patient's PMH is significant for the following: Iron Deficiency Anemia and Systemic Lupus Erythematous. Upon evaluation patient presents with slighty dimished sensation, and slight strength deficits specifically in LLE hip flexors. Overall patient demo WFL AROM, no increased fall risk with FGA score of 29/30, and good balance with ability to complete all conditions of M-CSTIB for 30 seconds. PT educating on evaluation findings and no need for PT services at this time, with patient and mom agreeing. PT educating to complete daily walking program for aerobic activity and provided information on this.    Personal Factors and  Comorbidities Comorbidity 2    Comorbidities : Iron Deficiency Anemia, Systemic Lupus Erythematous    Examination-Activity Limitations Stand    Examination-Participation Restrictions Occupation    Stability/Clinical Decision Making Stable/Uncomplicated    Clinical Decision Making Low    Consulted and Agree with Plan of Care Patient;Family member/caregiver    Family Member Consulted Mom           Patient will benefit from skilled therapeutic intervention in order to improve the following deficits and impairments:  Impaired sensation, Pain, Decreased strength, Decreased activity tolerance  Visit Diagnosis: Other disturbances of skin sensation  Muscle weakness (generalized)     Problem List Patient Active Problem List   Diagnosis Date Noted   Lupus (HCC) 04/27/2020   Iron deficiency 04/27/2020   Intracranial edema (HCC) 04/24/2020   ICH (intracerebral hemorrhage) (HCC) 04/24/2020   Intraparenchymal hematoma of brain (HCC) 04/24/2020   Acute ITP (HCC) 11/15/2018    Tempie Donning, PT, DPT 05/06/2020, 11:26 AM  [facility] [address] [place], [place], [address] Phone: [phone]   Fax:  [phone]  Name: Rebecca Bright MRN: [id number] Date of Birth: July 14, 1996

## 2020-05-06 NOTE — Patient Instructions (Signed)
Patient educated on graded aerobic exercise program: beginning with walking approx 15 minutes 1x/day for 5 days week. As able to progress, begin to add 2-3 minutes at a time until reach 30 minutes/daily. Educated on benefits of aerobic activity for Lupus and improved endurance.

## 2020-05-26 ENCOUNTER — Encounter (HOSPITAL_COMMUNITY): Payer: Self-pay | Admitting: Emergency Medicine

## 2020-05-26 ENCOUNTER — Other Ambulatory Visit: Payer: Self-pay

## 2020-05-26 ENCOUNTER — Emergency Department (HOSPITAL_COMMUNITY)
Admission: EM | Admit: 2020-05-26 | Discharge: 2020-05-26 | Disposition: A | Discharge status: Home / Self Care | Payer: BC Managed Care – PPO | Attending: Emergency Medicine | Admitting: Emergency Medicine

## 2020-05-26 ENCOUNTER — Emergency Department (HOSPITAL_COMMUNITY): Payer: BC Managed Care – PPO

## 2020-05-26 DIAGNOSIS — R519 Headache, unspecified: Secondary | ICD-10-CM | POA: Insufficient documentation

## 2020-05-26 DIAGNOSIS — G936 Cerebral edema: Secondary | ICD-10-CM | POA: Diagnosis not present

## 2020-05-26 MED ORDER — PROCHLORPERAZINE MALEATE 10 MG PO TABS
10.0000 mg | ORAL_TABLET | Freq: Once | ORAL | Status: AC
  Administered 2020-05-26: 10 mg via ORAL
  Filled 2020-05-26: qty 1

## 2020-05-26 MED ORDER — PROCHLORPERAZINE MALEATE 10 MG PO TABS
10.0000 mg | ORAL_TABLET | Freq: Two times a day (BID) | ORAL | 0 refills | Status: DC | PRN
Start: 2020-05-26 — End: 2020-07-02

## 2020-05-26 NOTE — ED Provider Notes (Signed)
[facility]-EMERGENCY DEPT [facility] Emergency Department Provider Note MRN:  [id number]  Arrival date & time: 05/26/20     Chief Complaint   Headache   History of Present Illness   Rebecca Bright is a 24 y.o. year-old female with a history of ITP, lupus presenting to the ED with chief complaint of headache.  Gradual onset headache that began 3 days ago, constant since that time.  4 out of 10 in severity, located behind the left eye.  Had a recent intracranial hemorrhage and is here to make sure that her brain is okay.  Denies numbness or weakness to the arms or legs, no nausea or vomiting, no chest pain or shortness of breath, no abdominal pain, no other complaints.  Review of Systems  A complete 10 system review of systems was obtained and all systems are negative except as noted in the HPI and PMH.   Patient's Health History    Past Medical History:  Diagnosis Date  . History of ITP   . Lupus [facility])     Past Surgical History:  Procedure Laterality Date  . HERNIA REPAIR    . IR ANGIO EXTERNAL CAROTID SEL EXT CAROTID UNI R MOD SED  04/27/2020  . IR ANGIO INTRA EXTRACRAN SEL INTERNAL CAROTID BILAT MOD SED  04/27/2020  . IR ANGIO VERTEBRAL SEL VERTEBRAL UNI R MOD SED  04/27/2020  . IR US GUIDE VASC ACCESS RIGHT  04/27/2020    Family History  Problem Relation Age of Onset  . Hypertension Mother   . Diabetes Father   . High Cholesterol Father     Social History   Socioeconomic History  . Marital status: Single    Spouse name: Not on file  . Number of children: Not on file  . Years of education: Not on file  . Highest education level: Not on file  Occupational History  . Not on file  Tobacco Use  . Smoking status: Never Smoker  . Smokeless tobacco: Never Used  Vaping Use  . Vaping Use: Never used  Substance and Sexual Activity  . Alcohol use: Never  . Drug use: Never  . Sexual activity: Not on file  Other Topics Concern  . Not on file  Social History Narrative   . Not on file   Social Determinants of Health   Financial Resource Strain:   . Difficulty of Paying Living Expenses: Not on file  Food Insecurity:   . Worried About Programme researcher, broadcasting/film/video in the Last Year: Not on file  . Ran Out of Food in the Last Year: Not on file  Transportation Needs:   . Lack of Transportation (Medical): Not on file  . Lack of Transportation (Non-Medical): Not on file  Physical Activity:   . Days of Exercise per Week: Not on file  . Minutes of Exercise per Session: Not on file  Stress:   . Feeling of Stress : Not on file  Social Connections:   . Frequency of Communication with Friends and Family: Not on file  . Frequency of Social Gatherings with Friends and Family: Not on file  . Attends Religious Services: Not on file  . Active Member of Clubs or Organizations: Not on file  . Attends Banker Meetings: Not on file  . Marital Status: Not on file  Intimate Partner Violence:   . Fear of Current or Ex-Partner: Not on file  . Emotionally Abused: Not on file  . Physically Abused: Not on file  .  Sexually Abused: Not on file     Physical Exam   Vitals:   05/26/20 1141 05/26/20 1426  BP: 140/79 124/75  Pulse: 87 92  Resp: 16 14  Temp: 98.3 F (36.8 C)   SpO2: 100% 100%    CONSTITUTIONAL: Well-appearing, NAD NEURO:  Alert and oriented x 3, normal and symmetric strength and sensation, normal coordination, normal speech, normal gait EYES:  eyes equal and reactive ENT/NECK:  no LAD, no JVD CARDIO: Regular rate, well-perfused, normal S1 and S2 PULM:  CTAB no wheezing or rhonchi GI/GU:  normal bowel sounds, non-distended, non-tender MSK/SPINE:  No gross deformities, no edema SKIN:  no rash, atraumatic PSYCH:  Appropriate speech and behavior  *Additional and/or pertinent findings included in MDM below  Diagnostic and Interventional Summary    EKG Interpretation  Date/Time:    Ventricular Rate:    PR Interval:    QRS Duration:   QT  Interval:    QTC Calculation:   R Axis:     Text Interpretation:        Labs Reviewed - No data to display  CT Head Wo Contrast  Final Result      Medications  prochlorperazine (COMPAZINE) tablet 10 mg (10 mg Oral Given 05/26/20 1743)     Procedures  /  Critical Care Procedures  ED Course and Medical Decision Making  I have reviewed the triage vital signs, the nursing notes, and pertinent available records from the EMR.  Listed above are laboratory and imaging tests that I personally ordered, reviewed, and interpreted and then considered in my medical decision making (see below for details).  CT head is overall reassuring, improving vasogenic edema.  Given her complex recent history, I consulted neurology and Dr. Amada Jupiter was able to review the imaging and agreed that there is no need for intervention at this time, especially given that patient's exam is very normal and she is well-appearing.  She is appropriate for outpatient follow-up.       Elmer Sow. Pilar Plate, MD [facility] Emergency Medicine [facility] [email or web address]  Final Clinical Impressions(s) / ED Diagnoses     ICD-10-CM   1. Acute nonintractable headache, unspecified headache type  R51.9     ED Discharge Orders         Ordered    prochlorperazine (COMPAZINE) 10 MG tablet  2 times daily PRN        05/26/20 1803    Ambulatory referral to Neurology       Comments: An appointment is requested in approximately: 4 weeks   05/26/20 1803           Discharge Instructions Discussed with and Provided to Patient:     Discharge Instructions     You were evaluated in the Emergency Department and after careful evaluation, we did not find any emergent condition requiring admission or further testing in the hospital.  Your exam/testing today was overall reassuring.  Please take Tylenol 1000 mg every 4-6 hours for headache.  He can also try the Compazine medication for headache.  We recommend  follow-up with the neurologists for further headache management if they continue.  Please return to the Emergency Department if you experience any worsening of your condition.  Thank you for allowing [place] to be a part of your care.       Sabas Sous, MD 05/26/20 [phone]

## 2020-05-26 NOTE — ED Triage Notes (Addendum)
Per pt, states she was discharged from [facility] on 8/17-states she had a brain bleed possibly related to vasculitis-history of lupus-was told to come to ED if she had a headache lasting more than a couple of days

## 2020-05-26 NOTE — ED Notes (Signed)
Informed EDP CT of head resulted

## 2020-05-26 NOTE — Discharge Instructions (Addendum)
You were evaluated in the Emergency Department and after careful evaluation, we did not find any emergent condition requiring admission or further testing in the hospital.  Your exam/testing today was overall reassuring.  Please take Tylenol 1000 mg every 4-6 hours for headache.  He can also try the Compazine medication for headache.  We recommend follow-up with the neurologists for further headache management if they continue.  Please return to the Emergency Department if you experience any worsening of your condition.  Thank you for allowing [place] to be a part of your care.

## 2020-06-04 DIAGNOSIS — M329 Systemic lupus erythematosus, unspecified: Secondary | ICD-10-CM | POA: Diagnosis not present

## 2020-06-04 DIAGNOSIS — D693 Immune thrombocytopenic purpura: Secondary | ICD-10-CM | POA: Diagnosis not present

## 2020-06-04 DIAGNOSIS — Z79899 Other long term (current) drug therapy: Secondary | ICD-10-CM | POA: Diagnosis not present

## 2020-06-04 DIAGNOSIS — L930 Discoid lupus erythematosus: Secondary | ICD-10-CM | POA: Diagnosis not present

## 2020-06-08 DIAGNOSIS — M329 Systemic lupus erythematosus, unspecified: Secondary | ICD-10-CM | POA: Diagnosis not present

## 2020-06-08 DIAGNOSIS — D509 Iron deficiency anemia, unspecified: Secondary | ICD-10-CM | POA: Diagnosis not present

## 2020-06-08 DIAGNOSIS — D693 Immune thrombocytopenic purpura: Secondary | ICD-10-CM | POA: Diagnosis not present

## 2020-06-09 MED FILL — PROMACTA 50 MG TABLET: 50 | 30 days supply | Qty: 30 | Fill #1

## 2020-07-02 ENCOUNTER — Encounter: Payer: Self-pay | Admitting: Neurology

## 2020-07-02 ENCOUNTER — Other Ambulatory Visit: Payer: Self-pay

## 2020-07-02 ENCOUNTER — Telehealth: Payer: Self-pay | Admitting: Neurology

## 2020-07-02 ENCOUNTER — Ambulatory Visit (INDEPENDENT_AMBULATORY_CARE_PROVIDER_SITE_OTHER): Payer: BC Managed Care – PPO | Admitting: Neurology

## 2020-07-02 VITALS — BP 121/76 | HR 84 | Ht 59.0 in | Wt 97.4 lb

## 2020-07-02 DIAGNOSIS — R413 Other amnesia: Secondary | ICD-10-CM | POA: Diagnosis not present

## 2020-07-02 DIAGNOSIS — G3184 Mild cognitive impairment, so stated: Secondary | ICD-10-CM

## 2020-07-02 DIAGNOSIS — I611 Nontraumatic intracerebral hemorrhage in hemisphere, cortical: Secondary | ICD-10-CM

## 2020-07-02 MED ORDER — PREVAGEN 10 MG PO CAPS: 1.0000 | ORAL_CAPSULE | Freq: Every morning | ORAL | 1 refills | Status: DC

## 2020-07-02 MED FILL — PROMACTA 50 MG TABLET: 50 | 30 days supply | Qty: 30 | Fill #2

## 2020-07-02 NOTE — Telephone Encounter (Signed)
LVM for pt to call back about scheduling mri  07/02/20 BCBS auth: [id number] (exp. 07/02/20 to 12/28/20)

## 2020-07-02 NOTE — Patient Instructions (Signed)
I had a long discussion with the patient regarding her recent right parietal intracerebral hemorrhage of indeterminate etiology but possibly related to her lupus and vasculitis.  She is doing quite well but does have mild residual memory loss and cognitive impairment.  I recommend we check follow-up MRI scan of the brain with and without contrast to rule out any underlying structural lesions.  Also repeat factor VIII level as it was elevated during the acute phase of her illness.  I encouraged her to increase participation in cognitively challenging activities like solving crossword puzzles, playing bridge and sodoku.  Start prevention 10 mg daily to help with some memory.  We also discussed memory compensation strategies.  She may need neuropsych testing in the future if she special accommodations for her college studies.  Continued follow-up and treatment for her lupus with her rheumatologist Dr. Zenovia Jordan.  She will return for follow-up in the future in 2 months or call earlier if necessary. Memory Compensation Strategies  1. Use "WARM" strategy.  W= write it down  A= associate it  R= repeat it  M= make a mental note  2.   You can keep a Glass blower/designer.  Use a 3-ring notebook with sections for the following: calendar, important names and phone numbers,  medications, doctors' names/phone numbers, lists/reminders, and a section to journal what you did  each day.   3.    Use a calendar to write appointments down.  4.    Write yourself a schedule for the day.  This can be placed on the calendar or in a separate section of the Memory Notebook.  Keeping a  regular schedule can help memory.  5.    Use medication organizer with sections for each day or morning/evening pills.  You may need help loading it  6.    Keep a basket, or pegboard by the door.  Place items that you need to take out with you in the basket or on the pegboard.  You may also want to  include a message board for  reminders.  7.    Use sticky notes.  Place sticky notes with reminders in a place where the task is performed.  For example: " turn off the  stove" placed by the stove, "lock the door" placed on the door at eye level, " take your medications" on  the bathroom mirror or by the place where you normally take your medications.  8.    Use alarms/timers.  Use while cooking to remind yourself to check on food or as a reminder to take your medicine, or as a  reminder to make a call, or as a reminder to perform another task, etc.

## 2020-07-02 NOTE — Progress Notes (Signed)
[facility] [address] [place]. [place] [address] [phone]       OFFICE FOLLOW-UP NOTE  [name] Date of Birth:  [birth year] Medical Record Number:  [id number]   HPI: Rebecca Bright is a 24 year old pleasant young lady seen today for initial office follow-up visit following intracerebral hemorrhage in August 2021.  History is obtained from the patient, review of electronic medical records and I personally reviewed pertinent imaging films in PACS.  She has a past medical history of lupus and ITP who presented on 04/24/2020 to [facility] with 1 week history of numbness involving left arm and leg and slight confusion.  She was also noted as bumping into things and felt that the left leg was heavy and would fall asleep.  The symptoms followed 1 week after she had noticed a mild throbbing headache as well as jaw pain radiating to the back of the neck and slight restriction of neck movements.  This jaw pain and neck pain resolved after she was admitted.  CT scan of the head was obtained at [facility] emergency room and showed what appeared to be a medium size masslike lesion in the right parietal lobe with large amount of surrounding edema raising concern for possible subacute hemorrhage.  She was transferred to [facility] where MRI scan of the brain was obtained which showed a large heterogeneous signal lesion in the right parasagittal parietal lobe with surrounding edema and mild regional mass-effect.  Area of hemorrhage measures 5.5 x 3.1 x 5.2 cm on SWI images with few small areas of intrinsic T1 shortening reflecting subacute blood products.  There appeared to be no underlying mass lesion this was felt to be parenchymal hemorrhage.  MRI of the brain and neck were both unremarkable.  MR venogram showed normal appearance of dural venous sinus cysts.  Cerebral catheter angiogram was also performed by Dr. Algis Downs. Dorice Lamas and was normal without  evidence of aneurysms, AVM or venous sinus thrombosis.  2D echo was unremarkable.  Urine drug screen was negative.  Patient was not on any antithrombotic therapy prior to admission.  She was seen by physical occupational therapy and discharged home with outpatient therapies.  Patient states she has done well since discharge.  Her left-sided numbness seems to have improved.  Her headaches are also much better though she still has daily mild headache which is not severe or bothersome.  Only rarely she needs to take Tylenol which helps.  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  She has noticed some cognitive difficulties with trouble with attention concentration and short-term memory.  She forgets what time the classes are in locations.  She denies any peripheral vision loss, gait or balance problems.  She has no prior history of strokes TIAs seizures, migraines neurological problems.  Among the lab work that she had during recent hospitalization her factor VIII levels were found to be elevated 192 do factor IX, XI and XII were all normal.  ANA was positive and SS 80 anteroantibodies were elevated in titer of 8.2.  She is presently on Plaquenil 200 mg daily as well as Promacta 50 mg daily for her ITP and lupus.  She recently saw Dr. Zenovia Jordan her rheumatologist who have felt her lupus was well controlled and she is also in remission from her ITP and her medications were not changed.  She also had a virtual video visit with her  hematologist Dr. Pamelia Hoit on 04/29/2020 who recommend she continue present treatment for ITP as she was in remission and also felt etiology of hemorrhage was indeterminate and not likely related to Promacta  ROS:   14 system review of systems is positive for memory loss, attention difficulties, numbness and all other systems negative  PMH:  Past Medical History:  Diagnosis Date  . History of ITP   . Lupus (HCC)   . Stroke [facility])      bleed/04/24/20    Social History:  Social History   Socioeconomic History  . Marital status: Single    Spouse name: Not on file  . Number of children: Not on file  . Years of education: Not on file  . Highest education level: Not on file  Occupational History  . Occupation: Full time student  Tobacco Use  . Smoking status: Never Smoker  . Smokeless tobacco: Never Used  Vaping Use  . Vaping Use: Never used  Substance and Sexual Activity  . Alcohol use: Never  . Drug use: Never  . Sexual activity: Not on file  Other Topics Concern  . Not on file  Social History Narrative   Lives alone   Right handed   Drinks 2-3 cups weekly   Social Determinants of Health   Financial Resource Strain:   . Difficulty of Paying Living Expenses: Not on file  Food Insecurity:   . Worried About Programme researcher, broadcasting/film/video in the Last Year: Not on file  . Ran Out of Food in the Last Year: Not on file  Transportation Needs:   . Lack of Transportation (Medical): Not on file  . Lack of Transportation (Non-Medical): Not on file  Physical Activity:   . Days of Exercise per Week: Not on file  . Minutes of Exercise per Session: Not on file  Stress:   . Feeling of Stress : Not on file  Social Connections:   . Frequency of Communication with Friends and Family: Not on file  . Frequency of Social Gatherings with Friends and Family: Not on file  . Attends Religious Services: Not on file  . Active Member of Clubs or Organizations: Not on file  . Attends Banker Meetings: Not on file  . Marital Status: Not on file  Intimate Partner Violence:   . Fear of Current or Ex-Partner: Not on file  . Emotionally Abused: Not on file  . Physically Abused: Not on file  . Sexually Abused: Not on file    Medications:   Current Outpatient Medications on File Prior to Visit  Medication Sig Dispense Refill  . acetaminophen (TYLENOL) 325 MG tablet Take 325-650 mg by mouth every 6 (six) hours as needed for  mild pain or headache.    Marland Kitchen CAMILA 0.35 MG tablet Take 1 tablet by mouth daily.    . hydroxychloroquine (PLAQUENIL) 200 MG tablet TAKE 1 TABLET BY MOUTH EVERY DAY (Patient taking differently: Take 200 mg by mouth daily. ) 30 tablet 0  . iron polysaccharides (NIFEREX) 150 MG capsule Take 1 capsule (150 mg total) by mouth 2 (two) times daily. 60 capsule 3  . prochlorperazine (COMPAZINE) 10 MG tablet Take 1 tablet (10 mg total) by mouth 2 (two) times daily as needed (headache). 20 tablet 0  . PROMACTA 50 MG tablet TAKE 1 TABLET (50 MG TOTAL) BY MOUTH DAILY. TAKE ON AN EMPTY STOMACH 1 HOUR BEFORE A MEAL OR 2 HOURS AFTER (Patient taking differently: TAKE 1 TABLET (50 MG TOTAL)  BY MOUTH DAILY. TAKE ON AN EMPTY STOMACH 1 HOUR BEFORE A MEAL OR 2 HOURS AFTER) 30 tablet 6   No current facility-administered medications on file prior to visit.    Allergies:   Allergies  Allergen Reactions  . Sulfa Antibiotics Other (See Comments)    Lupus increase   . Sulfur Other (See Comments)    Lupus Activity    Physical Exam General: Frail petite young [place] African origin lady, seated, in no evident distress Head: head normocephalic and atraumatic.  Neck: supple with no carotid or supraclavicular bruits Cardiovascular: regular rate and rhythm, no murmurs Musculoskeletal: no deformity Skin:  no rash/petichiae Vascular:  Normal pulses all extremities Vitals:   07/02/20 0857  BP: 121/76  Pulse: 84   Neurologic Exam Mental Status: Awake and fully alert. Oriented to place and time. Recent and remote memory intact. Attention span, concentration and fund of knowledge appropriate. Mood and affect appropriate.  Recall 3/3.  Clock drawing 4/4.  Montreal cognitive assessment test score 30/30.  Able to name 12 animals which can walk on 4 legs. Cranial Nerves: Fundoscopic exam reveals sharp disc margins. Pupils equal, briskly reactive to light. Extraocular movements full without nystagmus. Visual fields full to  confrontation. Hearing intact. Facial sensation intact. Face, tongue, palate moves normally and symmetrically.  Motor: Normal bulk and tone. Normal strength in all tested extremity muscles. Sensory.: intact to touch ,pinprick .position and vibratory sensation.  Coordination: Rapid alternating movements normal in all extremities. Finger-to-nose and heel-to-shin performed accurately bilaterally. Gait and Station: Arises from chair without difficulty. Stance is normal. Gait demonstrates normal stride length and balance . Able to heel, toe and tandem walk without difficulty.  Reflexes: 1+ and symmetric. Toes downgoing.   NIHSS  0 Modified Rankin  2   ASSESSMENT: 24 year old African lady of [place] origin with right parietal intracerebral hemorrhage of indeterminate etiology in August 2021.  Negative neuro vascular studies for venous sinus thrombosis, AVM or aneurysms.  She is doing clinically well except for mild memory issues and cognitive impairment.     PLAN: I had a long discussion with the patient regarding her recent right parietal intracerebral hemorrhage of indeterminate etiology but possibly related to her lupus and vasculitis.  She is doing quite well but does have mild residual memory loss and cognitive impairment.  I recommend we check follow-up MRI scan of the brain with and without contrast to rule out any underlying structural lesions.  Also repeat factor VIII level as it was elevated during the acute phase of her illness.  I encouraged her to increase participation in cognitively challenging activities like solving crossword puzzles, playing bridge and sodoku.  Start prevention 10 mg daily to help with some memory.  We also discussed memory compensation strategies.  She may need neuropsych testing in the future if she special accommodations for her college studies.  Continued follow-up and treatment for her lupus with her rheumatologist Dr. Zenovia Jordan.  She Bright return for follow-up  in the future in 2 months or call earlier if necessary. Greater than 50% of time during this 25 minute visit was spent on counseling,explanation of diagnosis of intracerebral hemorrhage and mild cognitive impairment, planning of further management, discussion with patient and family and coordination of care Delia Heady, MD Note: This document was prepared with digital dictation and possible smart phrase technology. Any transcriptional errors that result from this process are unintentional

## 2020-08-18 MED FILL — PROMACTA 50 MG TABLET: 50 | 30 days supply | Qty: 30 | Fill #3

## 2020-09-10 MED FILL — PROMACTA 50 MG TABLET: 50 | 30 days supply | Qty: 30 | Fill #4

## 2020-09-22 ENCOUNTER — Ambulatory Visit: Payer: BC Managed Care – PPO | Admitting: Neurology

## 2020-10-28 ENCOUNTER — Other Ambulatory Visit: Payer: Self-pay | Admitting: *Deleted

## 2020-10-28 MED ORDER — ELTROMBOPAG OLAMINE 50 MG PO TABS: ORAL_TABLET | ORAL | 6 refills | Status: DC

## 2020-12-03 ENCOUNTER — Other Ambulatory Visit: Payer: Self-pay

## 2020-12-03 ENCOUNTER — Encounter (HOSPITAL_BASED_OUTPATIENT_CLINIC_OR_DEPARTMENT_OTHER): Payer: Self-pay | Admitting: Family Medicine

## 2020-12-03 ENCOUNTER — Ambulatory Visit (INDEPENDENT_AMBULATORY_CARE_PROVIDER_SITE_OTHER): Payer: 59 | Admitting: Family Medicine

## 2020-12-03 ENCOUNTER — Other Ambulatory Visit (HOSPITAL_BASED_OUTPATIENT_CLINIC_OR_DEPARTMENT_OTHER): Payer: Self-pay

## 2020-12-03 VITALS — BP 112/70 | HR 79 | Ht <= 58 in | Wt 92.6 lb

## 2020-12-03 DIAGNOSIS — N63 Unspecified lump in unspecified breast: Secondary | ICD-10-CM | POA: Insufficient documentation

## 2020-12-03 DIAGNOSIS — Z7689 Persons encountering health services in other specified circumstances: Secondary | ICD-10-CM | POA: Diagnosis not present

## 2020-12-03 NOTE — Assessment & Plan Note (Signed)
Requests referral to OB/GYN for further evaluation and management Deferred physical exam at this time Advised to monitor the area, if any changes do occur, return the office for evaluation Referral placed to OB/GYN, if obtaining appointment with OB/GYN takes longer than 6 weeks, instructed to return the office for further evaluation to avoid prolonged wait

## 2020-12-03 NOTE — Progress Notes (Signed)
New Patient Office Visit  Subjective:  Patient ID: Rebecca Bright, female    DOB: 06-15-1996  Age: 25 y.o. MRN: [id number]  CC:  Chief Complaint  Patient presents with  . Breast Mass    HPI Rebecca Bright is a 25 year old female presenting to establish in clinic.  She has current concerns related to lump in left breast.  Past medical history includes lupus, intracerebral hemorrhage, ITP.  In regards to her chronic issues she follows regularly with rheumatology, neurology, hematology.  Left breast lump: First noticed sometime in the last 6 to 12 months.  Has not noticed significant change in the breast lump in regards to shape or size.  She denies any associated pain, tenderness.  No associated warmth, redness, discharge.  Denies any prior similar issues.  There is a family history, she does report that 2 days ago her paternal grandmother had a breast biopsy done that was reportedly cancerous.  Otherwise denies any other cases of breast cancer or ovarian cancer within the family.  Patient recently graduated from [employer] as a bio major, was in premed track.  Plans to take the MCAT in the upcoming year, discussed this further with her.  Past Medical History:  Diagnosis Date  . History of ITP   . Lupus (HCC)   . Stroke [facility])    bleed/04/24/20    Past Surgical History:  Procedure Laterality Date  . HERNIA REPAIR    . IR ANGIO EXTERNAL CAROTID SEL EXT CAROTID UNI R MOD SED  04/27/2020  . IR ANGIO INTRA EXTRACRAN SEL INTERNAL CAROTID BILAT MOD SED  04/27/2020  . IR ANGIO VERTEBRAL SEL VERTEBRAL UNI R MOD SED  04/27/2020  . IR US GUIDE VASC ACCESS RIGHT  04/27/2020    Family History  Problem Relation Age of Onset  . Hypertension Mother   . Diabetes Father   . High Cholesterol Father     Social History   Socioeconomic History  . Marital status: Single    Spouse name: Not on file  . Number of children: Not on file  . Years of education: Not on file  . Highest education  level: Not on file  Occupational History  . Occupation: Full time student  Tobacco Use  . Smoking status: Never Smoker  . Smokeless tobacco: Never Used  Vaping Use  . Vaping Use: Never used  Substance and Sexual Activity  . Alcohol use: Never  . Drug use: Never  . Sexual activity: Not on file  Other Topics Concern  . Not on file  Social History Narrative   Lives alone   Right handed   Drinks 2-3 cups weekly   Social Determinants of Health   Financial Resource Strain: Not on file  Food Insecurity: Not on file  Transportation Needs: Not on file  Physical Activity: Not on file  Stress: Not on file  Social Connections: Not on file  Intimate Partner Violence: Not on file    Objective:   Today's Vitals: BP 112/70   Pulse 79   Ht 4\' 10"  (1.473 m)   Wt 92 lb 9.6 oz (42 kg)   SpO2 99%   BMI 19.35 kg/m   Physical Exam  Pleasant 25 year old female in no acute distress Cardiovascular exam with regular rate and rhythm, no murmurs appreciated Lungs clear to auscultation bilaterally  Assessment & Plan:   Problem List Items Addressed This Visit      Other   Breast lump in female - Primary  Requests referral to OB/GYN for further evaluation and management Deferred physical exam at this time Advised to monitor the area, if any changes do occur, return the office for evaluation Referral placed to OB/GYN, if obtaining appointment with OB/GYN takes longer than 6 weeks, instructed to return the office for further evaluation to avoid prolonged wait      Relevant Orders   Ambulatory referral to Obstetrics / Gynecology    Other Visit Diagnoses    Encounter to establish care with new doctor       Relevant Orders   Ambulatory referral to Obstetrics / Gynecology      Outpatient Encounter Medications as of 12/03/2020  Medication Sig  . eltrombopag (PROMACTA) 50 MG tablet TAKE 1 TABLET (50 MG TOTAL) BY MOUTH DAILY. TAKE ON AN EMPTY STOMACH 1 HOUR BEFORE A MEAL OR 2 HOURS  AFTER  . hydroxychloroquine (PLAQUENIL) 200 MG tablet TAKE 1 TABLET BY MOUTH EVERY DAY (Patient taking differently: Take 200 mg by mouth daily.)  . [DISCONTINUED] acetaminophen (TYLENOL) 325 MG tablet Take 325-650 mg by mouth every 6 (six) hours as needed for mild pain or headache.  . [DISCONTINUED] Apoaequorin (PREVAGEN) 10 MG CAPS Take 1 capsule by mouth every morning.  . [DISCONTINUED] CAMILA 0.35 MG tablet Take 1 tablet by mouth daily.   No facility-administered encounter medications on file as of 12/03/2020.   Spent 45 minutes on this patient encounter, including preparation, chart review, face-to-face counseling with patient and coordination of care, and documentation of encounter  Follow-up: Return in about 6 months (around 06/05/2021) for follow up - in office for CPE -- can be sooner if patient requests.   Rebecca Hottinger J De Peru, MD

## 2020-12-03 NOTE — Patient Instructions (Addendum)
   Medication Instructions:  Your physician recommends that you continue on your current medications as directed. Please refer to the Current Medication list given to you today. --If you need a refill on any your medications before your next appointment, please call your pharmacy first. If no refills are authorized on file call the office.--  Procedures/Imaging: A referral has been placed for you to Dr. Hyacinth Meeker at [facility] for evaluation of breast lump and to establish OB care. Someone from the scheduling department will be in contact with you in regards to coordinating your consultation. If you do not hear from any of the schedulers within 7-10 business days please give our office a call.   Follow-Up: Your next appointment:   Your physician recommends that you schedule a follow-up appointment in: 6 MONTHS with Dr. De Peru for CPE  We recommend signing up for the patient portal called "MyChart".  Sign up information is provided on this After Visit Summary.  MyChart is used to connect with patients for Virtual Visits (Telemedicine).  Patients are able to view lab/test results, encounter notes, upcoming appointments, etc.  Non-urgent messages can be sent to your provider as well.   To learn more about what you can do with MyChart, go to [email or web address].    Thanks for letting us be apart of your health journey!!  Primary Care and Sports Medicine    Dr. de Peru and Shawna Clamp, DNP, AGNP

## 2020-12-09 ENCOUNTER — Other Ambulatory Visit: Payer: Self-pay

## 2020-12-09 ENCOUNTER — Encounter (HOSPITAL_BASED_OUTPATIENT_CLINIC_OR_DEPARTMENT_OTHER): Payer: Self-pay | Admitting: Obstetrics & Gynecology

## 2020-12-09 ENCOUNTER — Other Ambulatory Visit: Payer: Self-pay | Admitting: Pharmacist

## 2020-12-09 ENCOUNTER — Ambulatory Visit (HOSPITAL_BASED_OUTPATIENT_CLINIC_OR_DEPARTMENT_OTHER): Payer: 59 | Admitting: Obstetrics & Gynecology

## 2020-12-09 ENCOUNTER — Other Ambulatory Visit: Payer: Self-pay | Admitting: Obstetrics & Gynecology

## 2020-12-09 ENCOUNTER — Other Ambulatory Visit: Payer: Self-pay | Admitting: *Deleted

## 2020-12-09 VITALS — BP 136/87 | HR 97 | Ht <= 58 in | Wt 93.0 lb

## 2020-12-09 DIAGNOSIS — D693 Immune thrombocytopenic purpura: Secondary | ICD-10-CM

## 2020-12-09 DIAGNOSIS — N6320 Unspecified lump in the left breast, unspecified quadrant: Secondary | ICD-10-CM | POA: Diagnosis not present

## 2020-12-09 DIAGNOSIS — N63 Unspecified lump in unspecified breast: Secondary | ICD-10-CM

## 2020-12-09 MED ORDER — ELTROMBOPAG OLAMINE 50 MG PO TABS: ORAL_TABLET | ORAL | 6 refills | Status: DC

## 2020-12-09 NOTE — Progress Notes (Signed)
25 y.o. G0 single female here for new patient appt for left breast mas that has been present for about six months.  It is non tender.  She is worried that this is something serious as she hasn't had anything done about it.   Pt states that it is moveable.  States that it is about the size of a grape. She did state that her PGM was diagnosed with Breast cancer approximately one week ago. She did not undergpo BRCA testing. Pt would like to defer pelvic exam today.    Pt was scheduled for AEX but thinks she's had a pap smear at student health at [facility] within the last year.  Thinks pap was abnormal but does not know when follow up is/was due.  Feel we should try and get records as she is concerned about doing any test that would not be covered.  Patient's last menstrual period was 11/21/2020 (exact date).            reports that she has never smoked. She has never used smokeless tobacco. She reports that she does not drink alcohol and does not use drugs.  Past Medical History:  Diagnosis Date  . History of ITP   . Lupus ([place])   . Stroke [facility])    bleed/04/24/20    Past Surgical History:  Procedure Laterality Date  . HERNIA REPAIR    . IR ANGIO EXTERNAL CAROTID SEL EXT CAROTID UNI R MOD SED  04/27/2020  . IR ANGIO INTRA EXTRACRAN SEL INTERNAL CAROTID BILAT MOD SED  04/27/2020  . IR ANGIO VERTEBRAL SEL VERTEBRAL UNI R MOD SED  04/27/2020  . IR US GUIDE VASC ACCESS RIGHT  04/27/2020    Current Outpatient Medications  Medication Sig Dispense Refill  . eltrombopag (PROMACTA) 50 MG tablet TAKE 1 TABLET (50 MG TOTAL) BY MOUTH DAILY. TAKE ON AN EMPTY STOMACH 1 HOUR BEFORE A MEAL OR 2 HOURS AFTER 30 tablet 6  . hydroxychloroquine (PLAQUENIL) 200 MG tablet TAKE 1 TABLET BY MOUTH EVERY DAY (Patient taking differently: Take 200 mg by mouth daily.) 30 tablet 0   No current facility-administered medications for this visit.    Family History  Problem Relation Age of Onset  . Hypertension Mother   .  Diabetes Father   . High Cholesterol Father   . Breast cancer Paternal Grandmother        25s at diagnosis    Review of Systems  Constitutional: Negative.   Genitourinary: Negative.   Psychiatric/Behavioral: Negative.     Exam:   BP 136/87 (BP Location: Right Arm, Patient Position: Sitting, Cuff Size: Normal)   Pulse 97   Ht 4' 10"  (1.473 m)   Wt 93 lb (42.2 kg)   LMP 11/21/2020 (Exact Date)   BMI 19.44 kg/m   Height: 4' 10"  (147.3 cm)  Physical Exam Constitutional:      Appearance: Normal appearance.  Chest:  Breasts:     Right: Normal. No swelling, inverted nipple, mass, nipple discharge, skin change, tenderness, axillary adenopathy or supraclavicular adenopathy.     Left: No swelling, inverted nipple, nipple discharge, skin change, tenderness, axillary adenopathy or supraclavicular adenopathy.        Comments: Mobile, round, non tender mass about 2.5cm in diameter c/w benign process. Lymphadenopathy:     Upper Body:     Right upper body: No supraclavicular, axillary or pectoral adenopathy.     Left upper body: No supraclavicular, axillary or pectoral adenopathy.  Skin:    General:  Skin is warm.  Neurological:     General: No focal deficit present.     Mental Status: She is alert.  Psychiatric:        Mood and Affect: Mood normal.    Chaperone, Cydney Ok, CMA, was present for exam.  Assessment/Plan: 1. Left breast mass - left breast ultrasound ordered and scheduled for pt.  I feel certain this is benign due to round shape, distinct and smooth edges and mobile nature.  Feel this is most likely a fibroadenoma that will either be watched, biopsied, or excised (and possibly in the office).  Pt reassured but imaging highly recommended and scheduled prior to pt leaving office today.

## 2020-12-09 NOTE — Progress Notes (Signed)
Oral Oncology Pharmacist Encounter  Prescription refill for Promacta sent to [facility] in error. Patient's insurance now requires that Promacta be filled through [facility]. Prescription redirected to [facility].  Lenord Carbo, PharmD, BCPS Hematology/Oncology Clinical Pharmacist [facility] [phone] 12/09/2020 1:12 PM

## 2020-12-15 ENCOUNTER — Telehealth: Payer: Self-pay

## 2020-12-15 ENCOUNTER — Other Ambulatory Visit: Payer: Self-pay | Admitting: *Deleted

## 2020-12-15 ENCOUNTER — Other Ambulatory Visit (HOSPITAL_COMMUNITY): Payer: Self-pay

## 2020-12-15 DIAGNOSIS — D693 Immune thrombocytopenic purpura: Secondary | ICD-10-CM

## 2020-12-15 MED ORDER — ELTROMBOPAG OLAMINE 50 MG PO TABS
ORAL_TABLET | ORAL | 6 refills | Status: DC
  Filled 2020-12-15: qty 30, fill #0
  Filled 2020-12-16: qty 30, 30d supply, fill #0
  Filled 2021-01-07 – 2021-02-09 (×2): qty 30, 30d supply, fill #1
  Filled 2021-03-22: qty 30, 30d supply, fill #2

## 2020-12-15 MED ORDER — ELTROMBOPAG OLAMINE 50 MG PO TABS
ORAL_TABLET | ORAL | 6 refills | Status: DC
  Filled 2020-12-15: qty 30, fill #0

## 2020-12-15 NOTE — Telephone Encounter (Signed)
Oral Oncology Patient Advocate Encounter  Received notification from [employer] that prior authorization for Promacta is required.  PA submitted on CoverMyMeds Key [id number] Status is pending  Oral Oncology Clinic will continue to follow.  [name] CPHT Specialty Pharmacy Patient Advocate [facility] Phone [phone] Fax [phone] 12/15/2020 3:40 PM

## 2020-12-15 NOTE — Telephone Encounter (Signed)
Oral Oncology Patient Advocate Encounter  Prior Authorization for Promact has been approved for 1 fill.  PA# [id number] Effective dates: 12/15/20 through 01/13/21  Patients co-pay is $[id number]  Oral Oncology Clinic will continue to follow.   [name] CPHT Specialty Pharmacy Patient Advocate [facility] Phone [phone] Fax [phone] 12/15/2020 4:01 PM

## 2020-12-16 ENCOUNTER — Other Ambulatory Visit (HOSPITAL_COMMUNITY): Payer: Self-pay

## 2020-12-18 ENCOUNTER — Other Ambulatory Visit (HOSPITAL_COMMUNITY): Payer: Self-pay

## 2020-12-29 ENCOUNTER — Other Ambulatory Visit: Payer: 59

## 2021-01-07 ENCOUNTER — Other Ambulatory Visit (HOSPITAL_COMMUNITY): Payer: Self-pay

## 2021-01-12 ENCOUNTER — Other Ambulatory Visit (HOSPITAL_COMMUNITY): Payer: Self-pay

## 2021-01-14 ENCOUNTER — Other Ambulatory Visit (HOSPITAL_COMMUNITY): Payer: Self-pay

## 2021-01-15 ENCOUNTER — Inpatient Hospital Stay: Payer: 59 | Attending: Hematology and Oncology

## 2021-01-15 ENCOUNTER — Other Ambulatory Visit: Payer: Self-pay | Admitting: *Deleted

## 2021-01-15 ENCOUNTER — Encounter: Payer: Self-pay | Admitting: *Deleted

## 2021-01-15 ENCOUNTER — Other Ambulatory Visit (HOSPITAL_COMMUNITY): Payer: Self-pay

## 2021-01-15 DIAGNOSIS — M329 Systemic lupus erythematosus, unspecified: Secondary | ICD-10-CM | POA: Insufficient documentation

## 2021-01-15 DIAGNOSIS — D693 Immune thrombocytopenic purpura: Secondary | ICD-10-CM | POA: Insufficient documentation

## 2021-01-15 LAB — CBC WITH DIFFERENTIAL (CANCER CENTER ONLY)
Abs Immature Granulocytes: 0 10*3/uL (ref 0.00–0.07)
Basophils Absolute: 0 10*3/uL (ref 0.0–0.1)
Basophils Relative: 1 %
Eosinophils Absolute: 0.1 10*3/uL (ref 0.0–0.5)
Eosinophils Relative: 2 %
HCT: 37.1 % (ref 36.0–46.0)
Hemoglobin: 11.1 g/dL — ABNORMAL LOW (ref 12.0–15.0)
Immature Granulocytes: 0 %
Lymphocytes Relative: 60 %
Lymphs Abs: 2.4 10*3/uL (ref 0.7–4.0)
MCH: 22 pg — ABNORMAL LOW (ref 26.0–34.0)
MCHC: 29.9 g/dL — ABNORMAL LOW (ref 30.0–36.0)
MCV: 73.5 fL — ABNORMAL LOW (ref 80.0–100.0)
Monocytes Absolute: 0.2 10*3/uL (ref 0.1–1.0)
Monocytes Relative: 6 %
Neutro Abs: 1.2 10*3/uL — ABNORMAL LOW (ref 1.7–7.7)
Neutrophils Relative %: 31 %
Platelet Count: 159 10*3/uL (ref 150–400)
RBC: 5.05 MIL/uL (ref 3.87–5.11)
RDW: 14.4 % (ref 11.5–15.5)
WBC Count: 4 10*3/uL (ref 4.0–10.5)
nRBC: 0 % (ref 0.0–0.2)

## 2021-01-15 MED ORDER — HYDROXYCHLOROQUINE SULFATE 200 MG PO TABS
200.0000 mg | ORAL_TABLET | Freq: Every day | ORAL | 0 refills | Status: DC
  Filled 2021-01-15: qty 30, 30d supply, fill #0

## 2021-01-15 NOTE — Progress Notes (Signed)
Pt called stating recent insurance requests f/u with a [facility] Provider.  Pt requesting referral be placed for a [facility] Rheumatologist for treatment of Lupus.  Per MD pt to be referred to [facility] Rheumatology Dr. Corliss Skains.  Referral successfully placed.

## 2021-01-15 NOTE — Progress Notes (Signed)
Received message from [facility] rheumatology requesting previous rheumatology records be faxed to their office.  RN alerted pt to contact current rheumatologist and request records be faxed to Dr. Corliss Skains at [phone].  Pt verbalized understanding.

## 2021-01-18 ENCOUNTER — Other Ambulatory Visit (HOSPITAL_COMMUNITY): Payer: Self-pay

## 2021-01-18 NOTE — Telephone Encounter (Signed)
Oral Oncology Patient Advocate Encounter  Prior Authorization for Rebecca Bright has been approved.    PA# [id number] Effective dates: 01/16/21 through 01/15/22   Oral Oncology Clinic will continue to follow.   [name] CPHT Specialty Pharmacy Patient Advocate [facility] Phone [phone] Fax [phone] 01/18/2021 8:22 AM

## 2021-01-20 ENCOUNTER — Inpatient Hospital Stay: Admission: RE | Admit: 2021-01-20 | Payer: 59 | Source: Ambulatory Visit

## 2021-02-04 ENCOUNTER — Other Ambulatory Visit (HOSPITAL_COMMUNITY): Payer: Self-pay

## 2021-02-09 ENCOUNTER — Other Ambulatory Visit (HOSPITAL_COMMUNITY): Payer: Self-pay

## 2021-02-26 ENCOUNTER — Inpatient Hospital Stay: Admission: RE | Admit: 2021-02-26 | Payer: 59 | Source: Ambulatory Visit

## 2021-03-08 ENCOUNTER — Other Ambulatory Visit (HOSPITAL_COMMUNITY): Payer: Self-pay

## 2021-03-10 ENCOUNTER — Other Ambulatory Visit (HOSPITAL_COMMUNITY): Payer: Self-pay

## 2021-03-16 ENCOUNTER — Other Ambulatory Visit: Payer: Self-pay | Admitting: Hematology and Oncology

## 2021-03-16 ENCOUNTER — Other Ambulatory Visit (HOSPITAL_COMMUNITY): Payer: Self-pay

## 2021-03-16 MED ORDER — HYDROXYCHLOROQUINE SULFATE 200 MG PO TABS
200.0000 mg | ORAL_TABLET | Freq: Every day | ORAL | 0 refills | Status: DC
  Filled 2021-03-16: qty 30, 30d supply, fill #0

## 2021-03-22 ENCOUNTER — Other Ambulatory Visit (HOSPITAL_COMMUNITY): Payer: Self-pay

## 2021-03-29 ENCOUNTER — Other Ambulatory Visit: Payer: Self-pay | Admitting: Pharmacist

## 2021-03-29 ENCOUNTER — Telehealth: Payer: Self-pay

## 2021-03-29 ENCOUNTER — Other Ambulatory Visit (HOSPITAL_COMMUNITY): Payer: Self-pay

## 2021-03-29 DIAGNOSIS — D693 Immune thrombocytopenic purpura: Secondary | ICD-10-CM

## 2021-03-29 MED ORDER — ELTROMBOPAG OLAMINE 50 MG PO TABS: ORAL_TABLET | ORAL | 3 refills | Status: DC

## 2021-03-29 NOTE — Telephone Encounter (Signed)
Oral Oncology Patient Advocate Encounter   Received notification from [employer] that prior authorization for Promacta is required.   PA submitted on CoverMyMeds Key [id number] Status is pending   Oral Oncology Clinic will continue to follow.  [name] CPHT Specialty Pharmacy Patient Advocate [facility] Phone [phone] Fax [phone] 03/29/2021 10:34 AM

## 2021-03-29 NOTE — Progress Notes (Signed)
Oral Oncology Pharmacist Encounter  Patient with new insurance that now requires Promacta to be filled through [employer]. Remaining fills left on current prescription sent to [facility] for dispensing.   Tried calling patient to inform her of change in pharmacy. No answer. Left voicemail for call back.   Lenord Carbo, PharmD, BCPS Hematology/Oncology Clinical Pharmacist [facility] Oral Chemotherapy Navigation [facility] [phone] 03/29/2021 11:42 AM

## 2021-03-29 NOTE — Telephone Encounter (Signed)
Oral Oncology Patient Advocate Encounter  Prior Authorization for Val Riles has been approved.    PA# [id number] Effective dates: 03/29/21 through 03/29/22  Patient must fill with [facility] [facility]  Oral Oncology Clinic will continue to follow.   [name] CPHT Specialty Pharmacy Patient Advocate [facility] Phone [phone] Fax [phone] 03/29/2021 12:51 PM

## 2021-03-30 ENCOUNTER — Other Ambulatory Visit (HOSPITAL_COMMUNITY): Payer: Self-pay

## 2021-05-24 ENCOUNTER — Other Ambulatory Visit: Payer: Self-pay | Admitting: Hematology and Oncology

## 2021-05-25 ENCOUNTER — Telehealth: Payer: Self-pay | Admitting: Hematology and Oncology

## 2021-05-25 ENCOUNTER — Other Ambulatory Visit (HOSPITAL_COMMUNITY): Payer: Self-pay

## 2021-05-25 MED ORDER — HYDROXYCHLOROQUINE SULFATE 200 MG PO TABS
200.0000 mg | ORAL_TABLET | Freq: Every day | ORAL | 0 refills | Status: AC
  Filled 2021-05-25: qty 30, 30d supply, fill #0

## 2021-05-25 NOTE — Telephone Encounter (Signed)
Scheduled per sch msg. Called, not able to leave msg. Mailed printout  °

## 2021-06-07 ENCOUNTER — Encounter (HOSPITAL_BASED_OUTPATIENT_CLINIC_OR_DEPARTMENT_OTHER): Payer: 59 | Admitting: Family Medicine

## 2021-07-21 ENCOUNTER — Other Ambulatory Visit: Payer: Self-pay | Admitting: Hematology and Oncology

## 2021-07-21 ENCOUNTER — Other Ambulatory Visit (HOSPITAL_COMMUNITY): Payer: Self-pay

## 2021-07-22 ENCOUNTER — Other Ambulatory Visit (HOSPITAL_COMMUNITY): Payer: Self-pay

## 2021-07-28 ENCOUNTER — Other Ambulatory Visit: Payer: Self-pay | Admitting: Hematology and Oncology

## 2021-07-29 ENCOUNTER — Other Ambulatory Visit (HOSPITAL_COMMUNITY): Payer: Self-pay

## 2021-08-09 NOTE — Progress Notes (Incomplete)
°  HEMATOLOGY-ONCOLOGY [facility] VIDEO VISIT PROGRESS NOTE  I connected with Rebecca Bright on 08/09/2021 at  3:30 PM EST by MyChart video conference and verified that I am speaking with the correct person using two identifiers.  I discussed the limitations, risks, security and privacy concerns of performing an evaluation and management service by MyChart and the availability of in person appointments.  I also discussed with the patient that there may be a patient responsible charge related to this service. The patient expressed understanding and agreed to proceed.  Patient's Location: Home Physician Location: Clinic  CHIEF COMPLIANT: Follow-up of ITP and Lupus for recent bruising and petechiae  INTERVAL HISTORY: Rebecca Bright is a 25 y.o. female with above-mentioned history of ITP and Lupus currently on Promacta, started 05/25/19, and dexamethasone. She presents via MyChart today for follow-up.   Observations/Objective:  There were no vitals filed for this visit. There is no height or weight on file to calculate BMI.  I have reviewed the data as listed CMP Latest Ref Rng & Units 04/24/2020  Glucose 70 - 99 mg/dL 127(N)  BUN 6 - 20 mg/dL 10  Creatinine 1.70 - 0.17 mg/dL 4.94  Sodium 496 - 759 mmol/L 136  Potassium 3.5 - 5.1 mmol/L 3.5  Chloride 98 - 111 mmol/L 103  CO2 22 - 32 mmol/L 24  Calcium 8.9 - 10.3 mg/dL 9.2  Total Protein 6.5 - 8.1 g/dL 1.6(B)  Total Bilirubin 0.3 - 1.2 mg/dL <8.4(Y)  Alkaline Phos 38 - 126 U/L 58  AST 15 - 41 U/L 20  ALT 0 - 44 U/L 14    Lab Results  Component Value Date   WBC 4.0 01/15/2021   HGB 11.1 (L) 01/15/2021   HCT 37.1 01/15/2021   MCV 73.5 (L) 01/15/2021   PLT 159 01/15/2021   NEUTROABS 1.2 (L) 01/15/2021      Assessment Plan:  No problem-specific Assessment & Plan notes found for this encounter.    I discussed the assessment and treatment plan with the patient. The patient was provided an opportunity to ask questions and  all were answered. The patient agreed with the plan and demonstrated an understanding of the instructions. The patient was advised to call back or seek an in-person evaluation if the symptoms worsen or if the condition fails to improve as anticipated.   Total time spent: *** minutes including face-to-face MyChart video visit time and time spent for planning, charting and coordination of care  Sabas Sous, MD 08/09/2021  I, Alda Ponder am acting as scribe for Serena Croissant, MD.  {insert scribe attestation}

## 2021-08-10 ENCOUNTER — Ambulatory Visit: Payer: Self-pay | Admitting: Hematology and Oncology

## 2021-08-10 ENCOUNTER — Telehealth: Payer: Self-pay | Admitting: Hematology and Oncology

## 2021-08-10 NOTE — Assessment & Plan Note (Deleted)
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 She had a bone marrow biopsy there and was treated with IVIG prednisone and rituximab. 2.Frequent relapses every 6 months. She required IVIG prednisone and Rituxan. 3.Moved to [place] and relapsed 07/06/2015, 02/27/2016 at [facility] 02/27/2016: 4 days of Decadron and IVIG and hydroxyurea for lupus(platelet count of 12)followed by Rituxan 07/06/2015: Hospitalization for ITP: Steroids with IVIG 4.Patient investigated and found dapsone 100 mg daily which has been preventing relapses of ITP. 5.10/24/2018: Platelets 49 when she ran out of dapsone. 6.Relapse 01/27/2019: Dexamethasone went into remission 7.05/17/2019: Relapse platelet count 17 8. Dexamethasone4 mg dailywith Promacta 50 mg daily  ------------------------------------------------------------------------------------------------------------------------------------------------------ Hospitalization: 04/24/20-04/28/20: Intra cerebral hemorrhage (platelets > 400K), No surg intervention Angiograms: No aneurysms No clear cut etiology. PTT was prolonged (workup Negative; Factors 8,9,11 and 12 were normal) Iron def anemia: serum iron 33, transferrin saturation 8, ferritin 5, TIBC 403. received 1 dose of IV Iron    ITP: Remission, continue same Rx  Lupus: on hydroxychloroquine

## 2021-08-11 ENCOUNTER — Telehealth: Payer: Self-pay | Admitting: Physician Assistant

## 2021-08-11 DIAGNOSIS — R300 Dysuria: Secondary | ICD-10-CM

## 2021-08-11 NOTE — Progress Notes (Signed)
Virtual Visit Consent   Rebecca Bright, you are scheduled for a virtual visit with a [facility] provider today.     Just as with appointments in the office, your consent must be obtained to participate.  Your consent will be active for this visit and any virtual visit you may have with one of our providers in the next 365 days.     If you have a MyChart account, a copy of this consent can be sent to you electronically.  All virtual visits are billed to your insurance company just like a traditional visit in the office.    As this is a virtual visit, video technology does not allow for your provider to perform a traditional examination.  This may limit your provider's ability to fully assess your condition.  If your provider identifies any concerns that need to be evaluated in person or the need to arrange testing (such as labs, EKG, etc.), we will make arrangements to do so.     Although advances in technology are sophisticated, we cannot ensure that it will always work on either your end or our end.  If the connection with a video visit is poor, the visit may have to be switched to a telephone visit.  With either a video or telephone visit, we are not always able to ensure that we have a secure connection.     I need to obtain your verbal consent now.   Are you willing to proceed with your visit today?    [name] [name] has provided verbal consent on 08/11/2021 for a virtual visit (video or telephone).   Rebecca Bright, [place]   Date: 08/11/2021 7:03 PM   Virtual Visit via Video Note   I, Rebecca Bright, connected with  Rebecca Bright  ([id number], 09-04-1996) on 08/11/21 at  7:00 PM EST by a video-enabled telemedicine application and verified that I am speaking with the correct person using two identifiers.  Location: Patient: Virtual Visit Location Patient: Home Provider: Virtual Visit Location Provider: Home Office   I discussed the limitations of  evaluation and management by telemedicine and the availability of in person appointments. The patient expressed understanding and agreed to proceed.    History of Present Illness: Rebecca Bright is a 25 y.o. who identifies as a female who was assigned female at birth, and is being seen today for possible UTI. Endorses some dysuria over the past week with some associated urinary urgency and frequently that waxes and wanes. Denies hematuria. Denies vaginal discharge. Denies any fevers, chills. Initially some nausea without vomiting.  Some suprapubic pressure noted.   HPI: HPI  Problems:  Patient Active Problem List   Diagnosis Date Noted   Breast lump in female 12/03/2020   Lupus (HCC) 04/27/2020   Iron deficiency 04/27/2020   Intracranial edema (HCC) 04/24/2020   ICH (intracerebral hemorrhage) (HCC) 04/24/2020   Intraparenchymal hematoma of brain 04/24/2020   Acute ITP (HCC) 11/15/2018    Allergies:  Allergies  Allergen Reactions   Sulfa Antibiotics Other (See Comments)    Lupus increase    Medications:  Current Outpatient Medications:    eltrombopag (PROMACTA) 50 MG tablet, TAKE 1 TABLET (50 MG TOTAL) BY MOUTH DAILY. TAKE ON AN EMPTY STOMACH 1 HOUR BEFORE A MEAL OR 2 HOURS AFTER, Disp: 30 tablet, Rfl: 3   hydroxychloroquine (PLAQUENIL) 200 MG tablet, Take 1 tablet (200 mg total) by mouth daily., Disp: 30 tablet, Rfl: 0  Observations/Objective: Patient is  well-developed, well-nourished in no acute distress.  Resting comfortably at home.  Head is normocephalic, atraumatic.  No labored breathing. Speech is clear and coherent with logical content.  Patient is alert and oriented at baseline.   Assessment and Plan: 1. Dysuria Classic UTI symptoms without any alarm signs/symptoms. Will start empiric treatment for uncomplicated cystitis with Keflex 500 mg BID x 7 days. Supportive measures and OTC medications reviewed. If not resolving or anything new or worsening despite  treatment, she will need an in-office evaluation with examination, UA and culture.   Follow Up Instructions: I discussed the assessment and treatment plan with the patient. The patient was provided an opportunity to ask questions and all were answered. The patient agreed with the plan and demonstrated an understanding of the instructions.  A copy of instructions were sent to the patient via MyChart unless otherwise noted below.   The patient was advised to call back or seek an in-person evaluation if the symptoms worsen or if the condition fails to improve as anticipated.  Time:  I spent 13 minutes with the patient via telehealth technology discussing the above problems/concerns.    [name], PA-C

## 2021-08-11 NOTE — Patient Instructions (Signed)
Rebecca Bright, thank you for joining [name], PA-C for today's virtual visit.  While this provider is not your primary care provider (PCP), if your PCP is located in our provider database this encounter information will be shared with them immediately following your visit.  Consent: (Patient) Rebecca Bright provided verbal consent for this virtual visit at the beginning of the encounter.  Current Medications:  Current Outpatient Medications:    eltrombopag (PROMACTA) 50 MG tablet, TAKE 1 TABLET (50 MG TOTAL) BY MOUTH DAILY. TAKE ON AN EMPTY STOMACH 1 HOUR BEFORE A MEAL OR 2 HOURS AFTER, Disp: 30 tablet, Rfl: 3   hydroxychloroquine (PLAQUENIL) 200 MG tablet, Take 1 tablet (200 mg total) by mouth daily., Disp: 30 tablet, Rfl: 0   Medications ordered in this encounter:  No orders of the defined types were placed in this encounter.    *If you need refills on other medications prior to your next appointment, please contact your pharmacy*  Follow-Up: Call back or seek an in-person evaluation if the symptoms worsen or if the condition fails to improve as anticipated.  Other Instructions Your symptoms are consistent with a bladder infection, also called acute cystitis. Please take your antibiotic (Keflex) as directed until all pills are gone.  Stay very well hydrated.  Consider a daily probiotic (Align, Culturelle, or Activia) to help prevent stomach upset caused by the antibiotic.  Taking a probiotic daily may also help prevent recurrent UTIs.  Also consider taking AZO (Phenazopyridine) tablets to help decrease pain with urination.    Urinary Tract Infection A urinary tract infection (UTI) can occur any place along the urinary tract. The tract includes the kidneys, ureters, bladder, and urethra. A type of germ called bacteria often causes a UTI. UTIs are often helped with antibiotic medicine.  HOME CARE  If given, take antibiotics as told by your doctor. Finish them even  if you start to feel better. Drink enough fluids to keep your pee (urine) clear or pale yellow. Avoid tea, drinks with caffeine, and bubbly (carbonated) drinks. Pee often. Avoid holding your pee in for a long time. Pee before and after having sex (intercourse). Wipe from front to back after you poop (bowel movement) if you are a woman. Use each tissue only once. GET HELP RIGHT AWAY IF:  You have back pain. You have lower belly (abdominal) pain. You have chills. You feel sick to your stomach (nauseous). You throw up (vomit). Your burning or discomfort with peeing does not go away. You have a fever. Your symptoms are not better in 3 days. MAKE SURE YOU:  Understand these instructions. Will watch your condition. Will get help right away if you are not doing well or get worse. Document Released: 02/15/2008 Document Revised: 05/23/2012 Document Reviewed: 03/29/2012 [facility] Patient Information 2015 [place], [place]. This information is not intended to replace advice given to you by your health care provider. Make sure you discuss any questions you have with your health care provider.    If you have been instructed to have an in-person evaluation today at a local Urgent Care facility, please use the link below. It will take you to a list of all of our available [facility], including address, phone number and hours of operation. Please do not delay care.  [facility]  If you or a family member do not have a primary care provider, use the link below to schedule a visit and establish care. When you choose a  [facility] primary care physician or advanced practice provider, you gain a long-term partner in health. Find a Primary Care Provider  Learn more about 's in-office and virtual care options: [facility]

## 2021-08-13 MED ORDER — CEPHALEXIN 500 MG PO CAPS: 500.0000 mg | ORAL_CAPSULE | Freq: Two times a day (BID) | ORAL | 0 refills | Status: DC

## 2021-08-13 NOTE — Addendum Note (Signed)
Addended by: Margaretann Loveless on: 08/13/2021 05:29 PM   Modules accepted: Orders

## 2021-09-25 ENCOUNTER — Other Ambulatory Visit: Payer: Self-pay | Admitting: Hematology and Oncology

## 2021-09-25 DIAGNOSIS — D693 Immune thrombocytopenic purpura: Secondary | ICD-10-CM

## 2021-09-27 ENCOUNTER — Telehealth: Payer: Self-pay | Admitting: Hematology and Oncology

## 2021-09-27 NOTE — Telephone Encounter (Signed)
Scheduled appointment per 1/16 scheduling message. Patient is aware. °

## 2021-11-09 ENCOUNTER — Other Ambulatory Visit (HOSPITAL_COMMUNITY): Payer: Self-pay

## 2021-11-10 NOTE — Progress Notes (Signed)
? ?Patient Care Team: ?[place], Blondell Reveal, MD as PCP - General (Family Medicine) ? ?DIAGNOSIS:  ?  ICD-10-CM   ?1. Acute ITP (HCC)  D69.3 eltrombopag (PROMACTA) 50 MG tablet  ?  ? ? ?CHIEF COMPLIANT: Follow-up of ITP and Lupus ? ?INTERVAL HISTORY: Rebecca Bright is a 26 y.o. with above-mentioned history of ITP and Lupus currently on Promacta and dexamethasone. She presents to the clinic today for follow-up.  She is currently off Plaquenil because she cannot find a rheumatologist who can prescribe it to her yet.  She has been taking Promacta and appears to be doing well without any bleeding issues.  She is finally working for [employer] and would like to get her labs drawn at that location. ? ?ALLERGIES:  is allergic to sulfa antibiotics. ? ?MEDICATIONS:  ?Current Outpatient Medications  ?Medication Sig Dispense Refill  ? eltrombopag (PROMACTA) 50 MG tablet TAKE 1 TABLET BY MOUTH ONCE DAILY ON AN EMPTY STOMACH  AT LEAST 1 HOUR BEFORE OR 2 HOURS AFTER A MEAL 30 tablet 11  ? hydroxychloroquine (PLAQUENIL) 200 MG tablet Take 1 tablet (200 mg total) by mouth daily. 30 tablet 0  ? ?No current facility-administered medications for this visit.  ? ? ?PHYSICAL EXAMINATION: ?ECOG PERFORMANCE STATUS: 1 - Symptomatic but completely ambulatory ? ?Vitals:  ? 11/11/21 1208  ?BP: 116/69  ?Pulse: 75  ?Resp: 19  ?Temp: (!) 97.3 ?F (36.3 ?C)  ?SpO2: 100%  ? ?Filed Weights  ? 11/11/21 1208  ?Weight: 89 lb 14.4 oz (40.8 kg)  ? ? ?LABORATORY DATA:  ?I have reviewed the data as listed ?CMP Latest Ref Rng & Units 04/24/2020  ?Glucose 70 - 99 mg/dL 120(H)  ?BUN 6 - 20 mg/dL 10  ?Creatinine 0.44 - 1.00 mg/dL 0.72  ?Sodium 135 - 145 mmol/L 136  ?Potassium 3.5 - 5.1 mmol/L 3.5  ?Chloride 98 - 111 mmol/L 103  ?CO2 22 - 32 mmol/L 24  ?Calcium 8.9 - 10.3 mg/dL 9.2  ?Total Protein 6.5 - 8.1 g/dL 9.7(H)  ?Total Bilirubin 0.3 - 1.2 mg/dL <0.1(L)  ?Alkaline Phos 38 - 126 U/L 58  ?AST 15 - 41 U/L 20  ?ALT 0 - 44 U/L 14  ? ? ?Lab Results  ?Component  Value Date  ? WBC 4.0 01/15/2021  ? HGB 11.1 (L) 01/15/2021  ? HCT 37.1 01/15/2021  ? MCV 73.5 (L) 01/15/2021  ? PLT 159 01/15/2021  ? NEUTROABS 1.2 (L) 01/15/2021  ? ? ?ASSESSMENT & PLAN:  ?Acute ITP ([place]) ?ITP secondary to SLE ?Prior treatment: ?1.  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  She had a bone marrow biopsy there and was treated with IVIG prednisone and rituximab. ?2.  Frequent relapses every 6 months.  She required IVIG prednisone and Rituxan. ?3.  Moved to [place] and relapsed 07/06/2015, 02/27/2016 at [facility] ?02/27/2016: 4 days of Decadron and IVIG and hydroxyurea for lupus (platelet count of 12) followed by Rituxan ?07/06/2015: Hospitalization for ITP: Steroids with IVIG ?4.  Patient investigated and found dapsone 100 mg daily which has been preventing relapses of ITP. ?5.  10/24/2018: Platelets 49 when she ran out of dapsone. ?6.  Relapse 01/27/2019: Dexamethasone went into remission ?7.  05/17/2019: Relapse platelet count 17 ?8. Dexamethasone 4 mg daily with Promacta 50 mg daily  ?------------------------------------------------------------------------------------------------------------------------------------------------------ ?Hospitalization: 04/24/20-04/28/20: Intra cerebral hemorrhage (platelets > 400K), No surg intervention ?Angiograms: No aneurysms ?  ?History  of iron deficiency anemia ? ?Current treatment: Promacta maintenance ?She will check labs at [facility] on a monthly basis. ?  ?ITP: Remission, continue same Rx ?  ?Lupus: Trying to get the rheumatologist to see her. ? ? ? ?No orders of the defined types were placed in this encounter. ? ?The patient has a good understanding of the overall plan. she agrees with it. she will call with any problems that may develop before the next visit here. ? ?Total time spent: 20 mins including face to face time and time spent for planning, charting and  coordination of care ? ?Rulon Eisenmenger, MD, MPH ?11/11/2021 ? ?I, Thana Ates, am acting as scribe for Dr. Nicholas Lose. ? ?I have reviewed the above documentation for accuracy and completeness, and I agree with the above. ? ? ? ? ? ? ?

## 2021-11-10 NOTE — Assessment & Plan Note (Signed)
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 ?She had a bone marrow biopsy there and was treated with IVIG prednisone and rituximab. ?2.??Frequent relapses every 6 months. ?She required IVIG prednisone and Rituxan. ?3.??Moved to [place] and relapsed 07/06/2015, 02/27/2016 at [facility] ?02/27/2016: 4 days of Decadron and IVIG and hydroxyurea for lupus?(platelet count of 12)?followed by Rituxan ?07/06/2015: Hospitalization for ITP: Steroids with IVIG ?4.??Patient investigated and found dapsone 100 mg daily which has been preventing relapses of ITP. ?5.??10/24/2018: Platelets 49 when she ran out of dapsone. ?6.??Relapse 01/27/2019: Dexamethasone went into remission ?7.??05/17/2019: Relapse platelet count 17 ?8. Dexamethasone?4 mg daily?with Promacta 50 mg daily  ?------------------------------------------------------------------------------------------------------------------------------------------------------ ?Hospitalization: 04/24/20-04/28/20: Intra cerebral hemorrhage (platelets > 400K), No surg intervention ?Angiograms: No aneurysms ?No clear cut etiology. PTT was prolonged (workup Negative; Factors 8,9,11 and 12 were normal) ?Iron def anemia: serum iron 33, transferrin saturation 8, ferritin 5, TIBC 403. received 1 dose of IV Iron ?Offered additional IV Iron treatments Vs oral iron replacement therapy. ?? ?ITP: Remission, continue same Rx ?? ?Lupus: on hydroxychloroquine ?

## 2021-11-11 ENCOUNTER — Other Ambulatory Visit: Payer: Self-pay

## 2021-11-11 ENCOUNTER — Inpatient Hospital Stay: Payer: BC Managed Care – PPO | Attending: Hematology and Oncology | Admitting: Hematology and Oncology

## 2021-11-11 DIAGNOSIS — D693 Immune thrombocytopenic purpura: Secondary | ICD-10-CM | POA: Diagnosis not present

## 2021-11-11 DIAGNOSIS — M329 Systemic lupus erythematosus, unspecified: Secondary | ICD-10-CM | POA: Diagnosis not present

## 2021-11-11 MED ORDER — ELTROMBOPAG OLAMINE 50 MG PO TABS: ORAL_TABLET | ORAL | 11 refills | Status: DC

## 2021-12-30 ENCOUNTER — Encounter (HOSPITAL_COMMUNITY): Payer: Self-pay

## 2021-12-30 ENCOUNTER — Emergency Department (HOSPITAL_COMMUNITY): Payer: BC Managed Care – PPO

## 2021-12-30 ENCOUNTER — Other Ambulatory Visit: Payer: Self-pay

## 2021-12-30 ENCOUNTER — Emergency Department (HOSPITAL_COMMUNITY)
Admission: EM | Admit: 2021-12-30 | Discharge: 2021-12-30 | Disposition: A | Discharge status: Home / Self Care | Payer: BC Managed Care – PPO | Attending: Emergency Medicine | Admitting: Emergency Medicine

## 2021-12-30 DIAGNOSIS — D696 Thrombocytopenia, unspecified: Secondary | ICD-10-CM | POA: Diagnosis not present

## 2021-12-30 DIAGNOSIS — R519 Headache, unspecified: Secondary | ICD-10-CM | POA: Diagnosis not present

## 2021-12-30 DIAGNOSIS — S199XXA Unspecified injury of neck, initial encounter: Secondary | ICD-10-CM | POA: Diagnosis not present

## 2021-12-30 DIAGNOSIS — G9389 Other specified disorders of brain: Secondary | ICD-10-CM | POA: Diagnosis not present

## 2021-12-30 DIAGNOSIS — S0990XA Unspecified injury of head, initial encounter: Secondary | ICD-10-CM | POA: Diagnosis not present

## 2021-12-30 LAB — BASIC METABOLIC PANEL
Anion gap: 3 — ABNORMAL LOW (ref 5–15)
BUN: 10 mg/dL (ref 6–20)
CO2: 25 mmol/L (ref 22–32)
Calcium: 8.9 mg/dL (ref 8.9–10.3)
Chloride: 108 mmol/L (ref 98–111)
Creatinine, Ser: 0.58 mg/dL (ref 0.44–1.00)
GFR, Estimated: >60 mL/min (ref >60)
Glucose, Bld: 89 mg/dL (ref 70–99)
Potassium: 3.7 mmol/L (ref 3.5–5.1)
Sodium: 136 mmol/L (ref 135–145)

## 2021-12-30 LAB — CBC WITH DIFFERENTIAL/PLATELET
Abs Immature Granulocytes: 0.01 10*3/uL (ref 0.00–0.07)
Basophils Absolute: 0 10*3/uL (ref 0.0–0.1)
Basophils Relative: 1 %
Eosinophils Absolute: 0 10*3/uL (ref 0.0–0.5)
Eosinophils Relative: 1 %
HCT: 37.7 % (ref 36.0–46.0)
Hemoglobin: 11.2 g/dL — ABNORMAL LOW (ref 12.0–15.0)
Immature Granulocytes: 0 %
Lymphocytes Relative: 50 %
Lymphs Abs: 1.9 10*3/uL (ref 0.7–4.0)
MCH: 21 pg — ABNORMAL LOW (ref 26.0–34.0)
MCHC: 29.7 g/dL — ABNORMAL LOW (ref 30.0–36.0)
MCV: 70.7 fL — ABNORMAL LOW (ref 80.0–100.0)
Monocytes Absolute: 0.2 10*3/uL (ref 0.1–1.0)
Monocytes Relative: 6 %
Neutro Abs: 1.6 10*3/uL — ABNORMAL LOW (ref 1.7–7.7)
Neutrophils Relative %: 42 %
Platelets: 45 10*3/uL — ABNORMAL LOW (ref 150–400)
RBC: 5.33 MIL/uL — ABNORMAL HIGH (ref 3.87–5.11)
RDW: 16.7 % — ABNORMAL HIGH (ref 11.5–15.5)
WBC: 3.7 10*3/uL — ABNORMAL LOW (ref 4.0–10.5)
nRBC: 0 % (ref 0.0–0.2)

## 2021-12-30 LAB — I-STAT BETA HCG BLOOD, ED (MC, WL, AP ONLY): I-stat hCG, quantitative: <5 m[IU]/mL (ref <5)

## 2021-12-30 MED ORDER — METOCLOPRAMIDE HCL 5 MG/ML IJ SOLN
10.0000 mg | Freq: Once | INTRAMUSCULAR | Status: AC
  Administered 2021-12-30: 10 mg via INTRAVENOUS
  Filled 2021-12-30: qty 2

## 2021-12-30 MED ORDER — METOCLOPRAMIDE HCL 10 MG PO TABS
10.0000 mg | ORAL_TABLET | Freq: Four times a day (QID) | ORAL | 0 refills | Status: DC
Start: 2021-12-30 — End: 2023-02-28

## 2021-12-30 MED ORDER — DIPHENHYDRAMINE HCL 25 MG PO CAPS
25.0000 mg | ORAL_CAPSULE | Freq: Once | ORAL | Status: AC
  Administered 2021-12-30: 25 mg via ORAL
  Filled 2021-12-30: qty 1

## 2021-12-30 MED ORDER — SODIUM CHLORIDE 0.9 % IV BOLUS
1000.0000 mL | Freq: Once | INTRAVENOUS | Status: AC
  Administered 2021-12-30: 1000 mL via INTRAVENOUS

## 2021-12-30 MED ORDER — DEXAMETHASONE SODIUM PHOSPHATE 4 MG/ML IJ SOLN
4.0000 mg | Freq: Once | INTRAMUSCULAR | Status: AC
  Administered 2021-12-30: 4 mg via INTRAVENOUS
  Filled 2021-12-30: qty 1

## 2021-12-30 MED ORDER — IOHEXOL 350 MG/ML SOLN
100.0000 mL | Freq: Once | INTRAVENOUS | Status: AC | PRN
  Administered 2021-12-30: 100 mL via INTRAVENOUS

## 2021-12-30 NOTE — Discharge Instructions (Signed)
The CT scan of your head was reassuringly without any bleeding. ? ?Please take Tylenol 1000 mg every 6 hours as needed for pain.  Have given you the medicine Reglan to use as an additional headache medicine.  Take with 25 mg of Benadryl. ? ?Drink plenty of water refrain from caffeine recreational drugs or alcohol. ? ?Your platelet count was 45,000 please follow-up with your hematologist to discuss this finding.  You may always return to the emergency room for any new or concerning symptoms. ?

## 2021-12-30 NOTE — ED Provider Triage Note (Signed)
Emergency Medicine Provider Triage Evaluation Note ? ?Rebecca Bright , a 26 y.o. female  was evaluated in triage.  Pt complains of a right-sided headache and neck pain that occurred about 4 days ago.  Symptoms have been waxing and waning and improved with Tylenol.  Patient has a history of intracerebral hemorrhage in August 2021.  States that her pain today is similar to that occurrence.  Reports associated nausea and vomiting.  No numbness, weakness, visual changes. ? ?Physical Exam  ?BP 130/87 (BP Location: Left Arm)   Pulse 81   Temp 99 ?F (37.2 ?C) (Oral)   Resp 18   Ht 4\' 10"  (1.473 m)   Wt 40.8 kg   LMP 12/30/2021   SpO2 100%   BMI 18.81 kg/m?  ?Gen:   Awake, no distress   ?Resp:  Normal effort  ?MSK:   Moves extremities without difficulty  ?Other:  Speaking clearly and coherently.  No slurring.  Moving all 4 extremities.  No gross deficits. ? ?Medical Decision Making  ?Medically screening exam initiated at 4:03 PM.  Appropriate orders placed.  01/01/2022 was informed that the remainder of the evaluation will be completed by another provider, this initial triage assessment does not replace that evaluation, and the importance of remaining in the ED until their evaluation is complete. ?  ?Rebecca Coach, PA-C ?12/30/21 1604 ? ?

## 2021-12-30 NOTE — ED Triage Notes (Signed)
Patient reports that she had a brain bleed  in a few years ago. Patient states that she has been having pain from the right neck to the top of the head and nausea  x 4 days. ?

## 2021-12-30 NOTE — ED Provider Notes (Signed)
?[facility]-EMERGENCY DEPT ?Provider Note ? ? ?CSN: [id number] ?Arrival date & time: 12/30/21  1508 ? ?  ? ?History ? ?Chief Complaint  ?Patient presents with  ? Headache  ? ? ?Rebecca Bright is a 26 y.o. female. ? ? ?Headache ? ?Patient with history of intracerebral hemorrhage thought to be due to ITP with low platelet count presented emergency room today with 4 days of headache nausea and vomiting as result of headache.  Denies any numbness weakness slurred speech confusion or head injury. ? ?Patient states her headache has been persistent.  She has been taking Tylenol frequently for her symptoms no greater than eight 500 mg tablets per day ? ?She denies any chest pain or difficulty breathing. ? ?  ? ?Home Medications ?Prior to Admission medications   ?Medication Sig Start Date End Date Taking? Authorizing Provider  ?metoCLOPramide (REGLAN) 10 MG tablet Take 1 tablet (10 mg total) by mouth every 6 (six) hours. 12/30/21  Yes Curties Conigliaro S, PA  ?eltrombopag (PROMACTA) 50 MG tablet TAKE 1 TABLET BY MOUTH ONCE DAILY ON AN EMPTY STOMACH  AT LEAST 1 HOUR BEFORE OR 2 HOURS AFTER A MEAL 11/11/21   Serena CroissantGudena, Vinay, MD  ?hydroxychloroquine (PLAQUENIL) 200 MG tablet Take 1 tablet (200 mg total) by mouth daily. 05/25/21   Serena CroissantGudena, Vinay, MD  ?   ? ?Allergies    ?Sulfa antibiotics   ? ?Review of Systems   ?Review of Systems  ?Neurological:  Positive for headaches.  ? ?Physical Exam ?Updated Vital Signs ?BP 111/68   Pulse 68   Temp 98 ?F (36.7 ?C)   Resp 18   Ht 4\' 10"  (1.473 m)   Wt 40.8 kg   LMP 12/30/2021   SpO2 100%   BMI 18.81 kg/m?  ?Physical Exam ?Vitals and nursing note reviewed.  ?Constitutional:   ?   General: She is not in acute distress. ?HENT:  ?   Head: Normocephalic and atraumatic.  ?   Nose: Nose normal.  ?   Mouth/Throat:  ?   Mouth: Mucous membranes are dry.  ?   Comments: Mildly dry oral mucosa ?Eyes:  ?   General: No scleral icterus. ?Cardiovascular:  ?   Rate and Rhythm: Normal  rate and regular rhythm.  ?   Pulses: Normal pulses.  ?   Heart sounds: Normal heart sounds.  ?Pulmonary:  ?   Effort: Pulmonary effort is normal. No respiratory distress.  ?   Breath sounds: No wheezing.  ?Abdominal:  ?   Palpations: Abdomen is soft.  ?   Tenderness: There is no abdominal tenderness.  ?Musculoskeletal:  ?   Cervical back: Normal range of motion.  ?   Right lower leg: No edema.  ?   Left lower leg: No edema.  ?Skin: ?   General: Skin is warm and dry.  ?   Capillary Refill: Capillary refill takes less than 2 seconds.  ?Neurological:  ?   Mental Status: She is alert. Mental status is at baseline.  ?   Comments: Normal neurologic exam see below ? ?Alert and oriented to self, place, time and event.  ? ?Speech is fluent, clear without dysarthria or dysphasia.  ? ?Strength 5/5 in upper/lower extremities   ?Sensation intact in upper/lower extremities  ? ?Normal gait.  ?CN I not tested  ?CN II grossly intact visual fields bilaterally. Did not visualize posterior eye.  ?CN III, IV, VI PERRLA and EOMs intact bilaterally  ?CN V Intact sensation to  sharp and light touch to the face  ?CN VII facial movements symmetric  ?CN VIII not tested  ?CN IX, X no uvula deviation, symmetric rise of soft palate  ?CN XI 5/5 SCM and trapezius strength bilaterally  ?CN XII Midline tongue protrusion, symmetric L/R movements  ?   ?Psychiatric:     ?   Mood and Affect: Mood normal.     ?   Behavior: Behavior normal.  ? ? ?ED Results / Procedures / Treatments   ?Labs ?(all labs ordered are listed, but only abnormal results are displayed) ?Labs Reviewed  ?BASIC METABOLIC PANEL - Abnormal; Notable for the following components:  ?    Result Value  ? Anion gap 3 (*)   ? All other components within normal limits  ?CBC WITH DIFFERENTIAL/PLATELET - Abnormal; Notable for the following components:  ? WBC 3.7 (*)   ? RBC 5.33 (*)   ? Hemoglobin 11.2 (*)   ? MCV 70.7 (*)   ? MCH 21.0 (*)   ? MCHC 29.7 (*)   ? RDW 16.7 (*)   ? Platelets 45 (*)    ? Neutro Abs 1.6 (*)   ? All other components within normal limits  ?I-STAT BETA HCG BLOOD, ED ([facility], [facility], [facility] ONLY)  ? ? ?EKG ?None ? ?Radiology ?CT Angio Head W or Wo Contrast ? ?Result Date: 12/30/2021 ?CLINICAL DATA:  Headache, chronic, new features or increased frequency; Neck trauma, arterial injury suspected EXAM: CT ANGIOGRAPHY HEAD AND NECK TECHNIQUE: Multidetector CT imaging of the head and neck was performed using the standard protocol during bolus administration of intravenous contrast. Multiplanar CT image reconstructions and MIPs were obtained to evaluate the vascular anatomy. Carotid stenosis measurements (when applicable) are obtained utilizing NASCET criteria, using the distal internal carotid diameter as the denominator. RADIATION DOSE REDUCTION: This exam was performed according to the departmental dose-optimization program which includes automated exposure control, adjustment of the mA and/or kV according to patient size and/or use of iterative reconstruction technique. CONTRAST:  OMNIPAQUE IOHEXOL 350 MG/ML SOLN COMPARISON:  CT and MRI head and MRA from Jan 23, 2020. FINDINGS: CT HEAD FINDINGS Brain: Area of encephalomalacia and mineralization in the high right parasagittal parietal lobe, compatible with sequela of known prior hemorrhage. No evidence of acute hemorrhage, acute large vascular territory infarct, mass lesion, midline shift, or hydrocephalus. Vascular: Detailed below. Skull: No acute fracture. Sinuses: Visualized sinuses are largely clear. No acute orbital findings. Other: No mastoid effusions. Review of the MIP images confirms the above findings CTA NECK FINDINGS Aortic arch: Great vessel origins are patent without significant stenosis. Right carotid system: No evidence of dissection, stenosis (50% or greater) or occlusion. Left carotid system: No evidence of dissection, stenosis (50% or greater) or occlusion. Vertebral arteries: Mildly left dominant. No evidence of dissection,  stenosis (50% or greater) or occlusion. Skeleton: No acute finding. Other neck: No acute finding. Upper chest: Visualized lung apices are clear. Review of the MIP images confirms the above findings CTA HEAD FINDINGS Anterior circulation: Bilateral intracranial ICAs, MCAs, and ACAs are patent without proximal hemodynamically significant stenosis no aneurysm identified. Posterior circulation: Bilateral intradural vertebral arteries, basilar artery and posterior cerebral arteries are patent without proximal hemodynamically significant stenosis. No aneurysm identified. Venous sinuses: No evidence of dural venous sinus thrombosis. Review of the MIP images confirms the above findings IMPRESSION: CT head: 1. No evidence of acute intracranial abnormality. 2. Sequela of prior hemorrhage in the high right parasagittal parietal lobe. CTA head/neck: Normal  study. No large vessel occlusion or proximal hemodynamically significant stenosis in the head or neck. Electronically Signed   By: Feliberto Harts M.D.   On: 12/30/2021 20:44  ? ?CT Angio Neck W and/or Wo Contrast ? ?Result Date: 12/30/2021 ?CLINICAL DATA:  Headache, chronic, new features or increased frequency; Neck trauma, arterial injury suspected EXAM: CT ANGIOGRAPHY HEAD AND NECK TECHNIQUE: Multidetector CT imaging of the head and neck was performed using the standard protocol during bolus administration of intravenous contrast. Multiplanar CT image reconstructions and MIPs were obtained to evaluate the vascular anatomy. Carotid stenosis measurements (when applicable) are obtained utilizing NASCET criteria, using the distal internal carotid diameter as the denominator. RADIATION DOSE REDUCTION: This exam was performed according to the departmental dose-optimization program which includes automated exposure control, adjustment of the mA and/or kV according to patient size and/or use of iterative reconstruction technique. CONTRAST:  OMNIPAQUE IOHEXOL 350 MG/ML SOLN  COMPARISON:  CT and MRI head and MRA from Jan 23, 2020. FINDINGS: CT HEAD FINDINGS Brain: Area of encephalomalacia and mineralization in the high right parasagittal parietal lobe, compatible with sequela of

## 2021-12-31 ENCOUNTER — Other Ambulatory Visit: Payer: Self-pay | Admitting: *Deleted

## 2021-12-31 ENCOUNTER — Telehealth: Payer: Self-pay | Admitting: Hematology and Oncology

## 2021-12-31 DIAGNOSIS — D693 Immune thrombocytopenic purpura: Secondary | ICD-10-CM

## 2021-12-31 NOTE — Telephone Encounter (Signed)
Per 4/21 in basket called and left pt a message with appointment details.  Call back number left if changes are needed ?

## 2021-12-31 NOTE — Progress Notes (Signed)
Per MD pt needing hospital f/u with labs in 2 weeks.  RN placed scheduling message to arrange appts.  ?

## 2022-01-03 ENCOUNTER — Ambulatory Visit (HOSPITAL_BASED_OUTPATIENT_CLINIC_OR_DEPARTMENT_OTHER): Payer: BC Managed Care – PPO | Admitting: Family Medicine

## 2022-01-03 VITALS — BP 114/72 | HR 92 | Ht <= 58 in | Wt 93.0 lb

## 2022-01-03 DIAGNOSIS — R519 Headache, unspecified: Secondary | ICD-10-CM | POA: Diagnosis not present

## 2022-01-03 DIAGNOSIS — M329 Systemic lupus erythematosus, unspecified: Secondary | ICD-10-CM | POA: Diagnosis not present

## 2022-01-03 NOTE — Progress Notes (Signed)
? ? ?  Procedures performed today:   ? ?None. ? ?Independent interpretation of notes and tests performed by another provider:  ? ?None. ? ?Brief History, Exam, Impression, and Recommendations:   ? ?BP 114/72 (BP Location: Right Arm, Patient Position: Sitting, Cuff Size: Normal)   Pulse 92   Ht 4\' 10"  (1.473 m)   Wt 93 lb (42.2 kg)   LMP 12/30/2021   SpO2 99%   BMI 19.44 kg/m?  ? ?Headache ?Patient reports that over the past couple of months she has been having right-sided neck/throat pain with associated right-sided headache.  Symptoms have been intermittent, lasts a few days then subsides. Had some nausea and vomiting associated with most recent episode about 4 days ago - other episodes have not been as severe.  Most recent episode led to emergency department visit.  At that time she did have CT imaging completed which did not show any acute issues ?No hearing issues. No fevers, chills, sweats. No visual complaints ?On exam, no current tenderness to palpation along right side of neck, pharynx is clear with no significant erythema or exudate observed.  Extraocular movements are intact, pupils are equal, round and reactive to light and accommodation. ?I feel the patient's current symptoms may be related to headache syndrome, possibly migraine.  She does report family history of migraine in her sister. ?Given her neurologic history, will refer patient to neurology for further evaluation and recommendations ? ?Lupus (HCC) ?Reports having some issue with establishing with new rheumatologist.  Reports that her old rheumatologist office was not willing to fax records and so she was trying to have records emailed and did not receive them in time to establish with new rheumatologist and referral was closed ?She request to have new referral placed to establish with a new rheumatologist.  Referral placed today ? ?Was supposed to have breast 01/01/2022 completed through OB/GYN last year, however she reports that due to financial  reasons she did not have this completed.  Given that it has been more than a year, it appears that order may have expired.  Recommend that she reach out to OB/GYN office to discuss this and request to place new ultrasound order ? ? ?___________________________________________ ?Rebecca Casseus de [place], MD, ABFM, CAQSM ?Primary Care and Sports Medicine ?[facility] ?

## 2022-01-04 NOTE — Assessment & Plan Note (Signed)
Reports having some issue with establishing with new rheumatologist.  Reports that her old rheumatologist office was not willing to fax records and so she was trying to have records emailed and did not receive them in time to establish with new rheumatologist and referral was closed ?She request to have new referral placed to establish with a new rheumatologist.  Referral placed today ?

## 2022-01-04 NOTE — Assessment & Plan Note (Signed)
Patient reports that over the past couple of months she has been having right-sided neck/throat pain with associated right-sided headache.  Symptoms have been intermittent, lasts a few days then subsides. Had some nausea and vomiting associated with most recent episode about 4 days ago - other episodes have not been as severe.  Most recent episode led to emergency department visit.  At that time she did have CT imaging completed which did not show any acute issues ?No hearing issues. No fevers, chills, sweats. No visual complaints ?On exam, no current tenderness to palpation along right side of neck, pharynx is clear with no significant erythema or exudate observed.  Extraocular movements are intact, pupils are equal, round and reactive to light and accommodation. ?I feel the patient's current symptoms may be related to headache syndrome, possibly migraine.  She does report family history of migraine in her sister. ?Given her neurologic history, will refer patient to neurology for further evaluation and recommendations ?

## 2022-01-13 ENCOUNTER — Inpatient Hospital Stay: Payer: BC Managed Care – PPO

## 2022-01-13 ENCOUNTER — Other Ambulatory Visit: Payer: Self-pay

## 2022-01-13 ENCOUNTER — Inpatient Hospital Stay: Payer: BC Managed Care – PPO | Attending: Hematology and Oncology | Admitting: Hematology and Oncology

## 2022-01-13 DIAGNOSIS — D693 Immune thrombocytopenic purpura: Secondary | ICD-10-CM

## 2022-01-13 LAB — CBC WITH DIFFERENTIAL (CANCER CENTER ONLY)
Abs Immature Granulocytes: 0.01 10*3/uL (ref 0.00–0.07)
Basophils Absolute: 0 10*3/uL (ref 0.0–0.1)
Basophils Relative: 1 %
Eosinophils Absolute: 0.1 10*3/uL (ref 0.0–0.5)
Eosinophils Relative: 2 %
HCT: 33.4 % — ABNORMAL LOW (ref 36.0–46.0)
Hemoglobin: 9.9 g/dL — ABNORMAL LOW (ref 12.0–15.0)
Immature Granulocytes: 0 %
Lymphocytes Relative: 50 %
Lymphs Abs: 2.1 10*3/uL (ref 0.7–4.0)
MCH: 20.8 pg — ABNORMAL LOW (ref 26.0–34.0)
MCHC: 29.6 g/dL — ABNORMAL LOW (ref 30.0–36.0)
MCV: 70.2 fL — ABNORMAL LOW (ref 80.0–100.0)
Monocytes Absolute: 0.3 10*3/uL (ref 0.1–1.0)
Monocytes Relative: 8 %
Neutro Abs: 1.6 10*3/uL — ABNORMAL LOW (ref 1.7–7.7)
Neutrophils Relative %: 39 %
Platelet Count: 155 10*3/uL (ref 150–400)
RBC: 4.76 MIL/uL (ref 3.87–5.11)
RDW: 16.7 % — ABNORMAL HIGH (ref 11.5–15.5)
WBC Count: 4.1 10*3/uL (ref 4.0–10.5)
nRBC: 0 % (ref 0.0–0.2)

## 2022-01-13 MED ORDER — FERROUS GLUCONATE 324 (38 FE) MG PO TABS: 324.0000 mg | ORAL_TABLET | Freq: Every day | ORAL | 3 refills | Status: DC

## 2022-01-13 NOTE — Progress Notes (Signed)
? ?Patient Care Team: ?[place], Blondell Reveal, MD as PCP - General (Family Medicine) ? ?DIAGNOSIS:  ?Encounter Diagnosis  ?Name Primary?  ? Acute ITP (HCC)   ? ?CHIEF COMPLIANT: Follow-up of acute ITP ? ?INTERVAL HISTORY: Rebecca Bright is a 26 year old above-mentioned history of ITP who is currently being managed with Promacta.  She was in the hospital emergency room recently with severe headaches.  She did not have a stroke and she was found to have a severely low platelet count and was discharged home.  She is here today for follow-up and reports that the headaches have improved significantly.  She has not had any bleeding or bruising symptoms.  She tells me that she only missed 1 dosage of Promacta prior to the emergency room visit. ? ? ?ALLERGIES:  is allergic to sulfa antibiotics. ? ?MEDICATIONS:  ?Current Outpatient Medications  ?Medication Sig Dispense Refill  ? ferrous gluconate (FERGON) 324 MG tablet Take 1 tablet (324 mg total) by mouth daily with breakfast. 180 tablet 3  ? eltrombopag (PROMACTA) 50 MG tablet TAKE 1 TABLET BY MOUTH ONCE DAILY ON AN EMPTY STOMACH  AT LEAST 1 HOUR BEFORE OR 2 HOURS AFTER A MEAL 30 tablet 11  ? hydroxychloroquine (PLAQUENIL) 200 MG tablet Take 1 tablet (200 mg total) by mouth daily. (Patient not taking: Reported on 01/03/2022) 30 tablet 0  ? metoCLOPramide (REGLAN) 10 MG tablet Take 1 tablet (10 mg total) by mouth every 6 (six) hours. 30 tablet 0  ? ?No current facility-administered medications for this visit.  ? ? ?PHYSICAL EXAMINATION: ?ECOG PERFORMANCE STATUS: 1 - Symptomatic but completely ambulatory ? ?Vitals:  ? 01/13/22 1439  ?BP: 126/86  ?Pulse: 78  ?Resp: 18  ?Temp: 97.9 ?F (36.6 ?C)  ?SpO2: 100%  ? ?Filed Weights  ? 01/13/22 1439  ?Weight: 93 lb 6.4 oz (42.4 kg)  ? ?  ? ?LABORATORY DATA:  ?I have reviewed the data as listed ? ?  Latest Ref Rng & Units 12/30/2021  ?  5:18 PM 04/24/2020  ? 11:44 AM  ?CMP  ?Glucose 70 - 99 mg/dL 89   120    ?BUN 6 - 20 mg/dL 10   10     ?Creatinine 0.44 - 1.00 mg/dL 0.58   0.72    ?Sodium 135 - 145 mmol/L 136   136    ?Potassium 3.5 - 5.1 mmol/L 3.7   3.5    ?Chloride 98 - 111 mmol/L 108   103    ?CO2 22 - 32 mmol/L 25   24    ?Calcium 8.9 - 10.3 mg/dL 8.9   9.2    ?Total Protein 6.5 - 8.1 g/dL  9.7    ?Total Bilirubin 0.3 - 1.2 mg/dL  <0.1    ?Alkaline Phos 38 - 126 U/L  58    ?AST 15 - 41 U/L  20    ?ALT 0 - 44 U/L  14    ? ? ?Lab Results  ?Component Value Date  ? WBC 4.1 01/13/2022  ? HGB 9.9 (L) 01/13/2022  ? HCT 33.4 (L) 01/13/2022  ? MCV 70.2 (L) 01/13/2022  ? PLT 155 01/13/2022  ? NEUTROABS 1.6 (L) 01/13/2022  ? ? ?ASSESSMENT & PLAN:  ?Acute ITP ([place]) ?ITP secondary to SLE ?Prior treatment: ?1.  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  She had a bone marrow biopsy there and was treated with IVIG prednisone and  rituximab. ?2.  Frequent relapses every 6 months.  She required IVIG prednisone and Rituxan. ?3.  Moved to [place] and relapsed 07/06/2015, 02/27/2016 at [facility] ?02/27/2016: 4 days of Decadron and IVIG and hydroxyurea for lupus (platelet count of 12) followed by Rituxan ?07/06/2015: Hospitalization for ITP: Steroids with IVIG ?4.  Patient investigated and found dapsone 100 mg daily which has been preventing relapses of ITP. ?5.  10/24/2018: Platelets 49 when she ran out of dapsone. ?6.  Relapse 01/27/2019: Dexamethasone went into remission ?7.  05/17/2019: Relapse platelet count 17 ?8. Dexamethasone 4 mg daily with Promacta 50 mg daily  ?------------------------------------------------------------------------------------------------------------------------------------------------------ ?Hospitalization: 04/24/20-04/28/20: Intra cerebral hemorrhage (platelets > 400K), No surg intervention ?Angiograms: No aneurysms ?  ?History of iron deficiency anemia 12/30/2021: WBC 3.7, hemoglobin 11.2, MCV 70.7, platelets 45  ?Current treatment: Promacta  maintenance ?Worsening anemia: I started her on oral iron therapy ?Recheck labs and follow-up in 3 months.  If she does not improve then she will need IV iron therapy. ? ? ?Orders Placed This Encounter  ?Procedures  ? CBC with Differential ([place] Only)  ?  Standing Status:   Future  ?  Standing Expiration Date:   01/14/2023  ? Ferritin  ?  Standing Status:   Future  ?  Standing Expiration Date:   01/13/2023  ? Iron and Iron Binding Capacity ([facility] only)  ?  Standing Status:   Future  ?  Standing Expiration Date:   01/14/2023  ? ?The patient has a good understanding of the overall plan. she agrees with it. she will call with any problems that may develop before the next visit here. ?Total time spent: 30 mins including face to face time and time spent for planning, charting and co-ordination of care ? ? Harriette Ohara, MD ?01/13/22 ? ? ? ?

## 2022-01-13 NOTE — Assessment & Plan Note (Signed)
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 ?She had a bone marrow biopsy there and was treated with IVIG prednisone and rituximab. ?2.??Frequent relapses every 6 months. ?She required IVIG prednisone and Rituxan. ?3.??Moved to [place] and relapsed 07/06/2015, 02/27/2016 at [facility] ?02/27/2016: 4 days of Decadron and IVIG and hydroxyurea for lupus?(platelet count of 12)?followed by Rituxan ?07/06/2015: Hospitalization for ITP: Steroids with IVIG ?4.??Patient investigated and found dapsone 100 mg daily which has been preventing relapses of ITP. ?5.??10/24/2018: Platelets 49 when she ran out of dapsone. ?6.??Relapse 01/27/2019: Dexamethasone went into remission ?7.??05/17/2019: Relapse platelet count 17 ?8. Dexamethasone?4 mg daily?with Promacta 50 mg daily  ?------------------------------------------------------------------------------------------------------------------------------------------------------ ?Hospitalization: 04/24/20-04/28/20: Intra cerebral hemorrhage (platelets > 400K), No surg intervention ?Angiograms: No aneurysms ?  ?History of iron deficiency anemia 12/30/2021: WBC 3.7, hemoglobin 11.2, MCV 70.7, platelets 45? ?Current treatment: Promacta maintenance ?Worsening anemia: Check iron studies ? ?

## 2022-03-07 ENCOUNTER — Ambulatory Visit (HOSPITAL_BASED_OUTPATIENT_CLINIC_OR_DEPARTMENT_OTHER): Payer: BC Managed Care – PPO | Admitting: Obstetrics & Gynecology

## 2022-03-07 ENCOUNTER — Institutional Professional Consult (permissible substitution): Payer: Self-pay | Admitting: Neurology

## 2022-04-01 ENCOUNTER — Telehealth: Payer: Self-pay | Admitting: Pharmacy Technician

## 2022-04-01 ENCOUNTER — Other Ambulatory Visit (HOSPITAL_COMMUNITY): Payer: Self-pay

## 2022-04-01 NOTE — Telephone Encounter (Signed)
Oral Oncology Patient Advocate Encounter  Prior Authorization for Rebecca Bright has been approved.    PA# [id number] Effective dates: 04/01/2022 through 04/02/2023  Patient may to continue to fill at [facility].    Jinger Neighbors, CPhT-Adv Pharmacy Patient Advocate Specialist [facility] Patient Advocate Team Direct Number: [phone]  Fax: [phone]

## 2022-04-01 NOTE — Telephone Encounter (Signed)
Oral Oncology Patient Advocate Encounter   Received notification that prior authorization for Promacta is due for renewal.   PA submitted on 04/01/2022 Key [id number] Status is pending     Jinger Neighbors, CPhT-Adv Pharmacy Patient Advocate Specialist [facility] Patient Advocate Team Direct Number: [phone]  Fax: [phone]

## 2022-04-08 NOTE — Progress Notes (Signed)
Error

## 2022-04-13 ENCOUNTER — Inpatient Hospital Stay: Payer: BC Managed Care – PPO | Attending: Hematology and Oncology

## 2022-04-15 ENCOUNTER — Telehealth: Payer: Self-pay | Admitting: Hematology and Oncology

## 2022-04-15 ENCOUNTER — Telehealth: Payer: Self-pay | Admitting: *Deleted

## 2022-04-15 ENCOUNTER — Inpatient Hospital Stay: Payer: BC Managed Care – PPO | Admitting: Hematology and Oncology

## 2022-04-15 NOTE — Assessment & Plan Note (Signed)
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 She had a bone marrow biopsy there and was treated with IVIG prednisone and rituximab. 2.Frequent relapses every 6 months. She required IVIG prednisone and Rituxan. 3.Moved to [place] and relapsed 07/06/2015, 02/27/2016 at [facility] 02/27/2016: 4 days of Decadron and IVIG and hydroxyurea for lupus(platelet count of 12)followed by Rituxan 07/06/2015: Hospitalization for ITP: Steroids with IVIG 4.Patient investigated and found dapsone 100 mg daily which has been preventing relapses of ITP. 5.10/24/2018: Platelets 49 when she ran out of dapsone. 6.Relapse 01/27/2019: Dexamethasone went into remission 7.05/17/2019: Relapse platelet count 17 8. Dexamethasone4 mg dailywith Promacta 50 mg daily  ------------------------------------------------------------------------------------------------------------------------------------------------------ Hospitalization: 04/24/20-04/28/20: Intra cerebral hemorrhage (platelets > 400K), No surg intervention Angiograms: No aneurysms  History of iron deficiency anemia 12/30/2021: WBC 3.7, hemoglobin 11.2, MCV 70.7, platelets 45  Current treatment: Promacta maintenance Worsening anemia: I started her on oral iron therapy Recheck labs and follow-up in 3 months.  If she does not improve then she will need IV iron therapy.

## 2022-04-15 NOTE — Telephone Encounter (Signed)
Scheduled appointment per 8/4 staff message. Left voicemail.

## 2022-04-15 NOTE — Telephone Encounter (Signed)
RN attempt x1 to contact pt regarding telephone visit today.  No answer, LVM for pt to return call to the office.  Pt was scheduled for labs 8/2 and did not show to lab appt.  Telephone visit for today canceled. Message sent to scheduling team to reschedule.

## 2022-04-18 NOTE — Progress Notes (Signed)
This encounter was created in error - please disregard.

## 2022-04-26 ENCOUNTER — Inpatient Hospital Stay: Payer: BC Managed Care – PPO

## 2022-04-28 ENCOUNTER — Inpatient Hospital Stay: Payer: BC Managed Care – PPO | Admitting: Hematology and Oncology

## 2022-04-28 ENCOUNTER — Telehealth: Payer: Self-pay | Admitting: *Deleted

## 2022-04-28 NOTE — Telephone Encounter (Signed)
Per MD appt for today needing to be canceled due to pt not showing for lab work earlier this week.  RN attempt x1 to contact pt.  No answer. LVM with pt stating this is the second no show for lab and at this time we will not rescheduled.  LVM requesting pt call our office to reschedule lab and MD f/u.

## 2022-04-28 NOTE — Assessment & Plan Note (Deleted)
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 She had a bone marrow biopsy there and was treated with IVIG prednisone and rituximab. 2.Frequent relapses every 6 months. She required IVIG prednisone and Rituxan. 3.Moved to [place] and relapsed 07/06/2015, 02/27/2016 at [facility] 02/27/2016: 4 days of Decadron and IVIG and hydroxyurea for lupus(platelet count of 12)followed by Rituxan 07/06/2015: Hospitalization for ITP: Steroids with IVIG 4.Patient investigated and found dapsone 100 mg daily which has been preventing relapses of ITP. 5.10/24/2018: Platelets 49 when she ran out of dapsone. 6.Relapse 01/27/2019: Dexamethasone went into remission 7.05/17/2019: Relapse platelet count 17 8. Dexamethasone4 mg dailywith Promacta 50 mg daily  ------------------------------------------------------------------------------------------------------------------------------------------------------ Hospitalization: 04/24/20-04/28/20: Intra cerebral hemorrhage (platelets > 400K), No surg intervention Angiograms: No aneurysms  History of iron deficiency anemia 12/30/2021: WBC 3.7, hemoglobin 11.2, MCV 70.7, platelets 45  Current treatment: Promacta maintenance Worsening anemia: I started her on oral iron therapy Recheck labs and follow-up in 3 months.  If she does not improve then she will need IV iron therapy. 

## 2022-05-03 ENCOUNTER — Telehealth (HOSPITAL_BASED_OUTPATIENT_CLINIC_OR_DEPARTMENT_OTHER): Payer: Self-pay | Admitting: Advanced Practice Midwife

## 2022-05-03 ENCOUNTER — Ambulatory Visit (HOSPITAL_BASED_OUTPATIENT_CLINIC_OR_DEPARTMENT_OTHER): Payer: BC Managed Care – PPO | Admitting: Advanced Practice Midwife

## 2022-05-03 NOTE — Telephone Encounter (Signed)
Called patient and left a message to please call the office back to reschedule her no show appointment .

## 2022-07-04 ENCOUNTER — Ambulatory Visit (HOSPITAL_BASED_OUTPATIENT_CLINIC_OR_DEPARTMENT_OTHER): Payer: BC Managed Care – PPO | Admitting: Family Medicine

## 2022-07-19 ENCOUNTER — Other Ambulatory Visit (HOSPITAL_COMMUNITY): Payer: Self-pay

## 2022-07-19 ENCOUNTER — Telehealth: Payer: Self-pay | Admitting: Pharmacy Technician

## 2022-07-19 NOTE — Telephone Encounter (Signed)
Oral Oncology Patient Advocate Encounter  Prior Authorization for [name] has been approved.    PA# [id number] Effective dates: 06/19/22 through 07/19/23  Patient must fill at [place], Rebecca Bright Patient [place] Direct Number: [phone]  Fax: [phone]

## 2022-07-20 ENCOUNTER — Other Ambulatory Visit: Payer: Self-pay | Admitting: *Deleted

## 2022-07-20 ENCOUNTER — Other Ambulatory Visit (HOSPITAL_COMMUNITY): Payer: Self-pay

## 2022-07-20 ENCOUNTER — Encounter: Payer: Self-pay | Admitting: Pharmacy Technician

## 2022-07-20 DIAGNOSIS — D693 Immune thrombocytopenic purpura: Secondary | ICD-10-CM

## 2022-07-20 MED ORDER — ELTROMBOPAG OLAMINE 50 MG PO TABS: ORAL_TABLET | ORAL | 11 refills | Status: DC

## 2022-09-06 ENCOUNTER — Other Ambulatory Visit (HOSPITAL_COMMUNITY): Payer: Self-pay

## 2022-11-11 ENCOUNTER — Other Ambulatory Visit: Payer: Self-pay | Admitting: *Deleted

## 2022-11-11 DIAGNOSIS — D693 Immune thrombocytopenic purpura: Secondary | ICD-10-CM

## 2022-11-11 NOTE — Progress Notes (Signed)
Pt requesting lab orders be scanned to her email due to living out of town.  Pt states she will go to a local [facility] to have labs drawn and will request results to be faxed to our office.  RN successfully scanned orders to pt email address on file.

## 2022-11-14 ENCOUNTER — Inpatient Hospital Stay: Payer: Self-pay | Admitting: Hematology and Oncology

## 2022-11-14 ENCOUNTER — Inpatient Hospital Stay: Payer: Self-pay

## 2022-11-22 ENCOUNTER — Encounter: Payer: Self-pay | Admitting: Hematology and Oncology

## 2022-11-22 ENCOUNTER — Inpatient Hospital Stay: Payer: Self-pay | Admitting: Hematology and Oncology

## 2022-11-22 ENCOUNTER — Inpatient Hospital Stay: Payer: 59 | Attending: Hematology and Oncology | Admitting: Hematology and Oncology

## 2022-11-22 DIAGNOSIS — D693 Immune thrombocytopenic purpura: Secondary | ICD-10-CM

## 2022-11-22 MED ORDER — DEXAMETHASONE 4 MG PO TABS: 4.0000 mg | ORAL_TABLET | Freq: Every day | ORAL | 0 refills | Status: DC

## 2022-11-22 NOTE — Assessment & Plan Note (Addendum)
ITP secondary to SLE Prior treatment: 1.  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  She had a bone marrow biopsy there and was treated with IVIG prednisone and rituximab. 2.  Frequent relapses every 6 months.  She required IVIG prednisone and Rituxan. 3.  Moved to [place] and relapsed 07/06/2015, 02/27/2016 at [facility] 02/27/2016: 4 days of Decadron and IVIG and hydroxyurea for lupus (platelet count of 12) followed by Rituxan 07/06/2015: Hospitalization for ITP: Steroids with IVIG 4.  Patient investigated and found dapsone 100 mg daily which has been preventing relapses of ITP. 5.  10/24/2018: Platelets 49 when she ran out of dapsone. 6.  Relapse 01/27/2019: Dexamethasone went into remission 7.  05/17/2019: Relapse platelet count 17 8. Dexamethasone 4 mg daily with Promacta 50 mg daily  ------------------------------------------------------------------------------------------------------------------------------------------------------ Hospitalization: 04/24/20-04/28/20: Intra cerebral hemorrhage (platelets > 400K), No surg intervention Angiograms: No aneurysms   History of iron deficiency anemia  12/30/2021: WBC 3.7, hemoglobin 11.2, MCV 70.7, platelets 45  11/18/2022: WBC:7, hemoglobin 8.4, MCV 64, RDW 16, platelets 24, ferritin 18  Relapsed ITP: Dexamethasone 4 mg daily x 5 days  Worsening anemia: I recommended giving her 1000 mg of Monoferric.  She lives in [place] and wishes to drive all the way to [facility] for her iron infusion.  Therefore we felt 1 dose of Monoferric would be better for her. When she comes for her iron infusion we will recheck labs

## 2022-11-22 NOTE — Progress Notes (Signed)
HEMATOLOGY-ONCOLOGY TELEPHONE VISIT PROGRESS NOTE  I connected with our patient on 11/22/22 at 12:15 PM EDT by telephone and verified that I am speaking with the correct person using two identifiers.  I discussed the limitations, risks, security and privacy concerns of performing an evaluation and management service by telephone and the availability of in person appointments.  I also discussed with the patient that there may be a patient responsible charge related to this service. The patient expressed understanding and agreed to proceed.   History of Present Illness:   REVIEW OF SYSTEMS:   Constitutional: Denies fevers, chills or abnormal weight loss All other systems were reviewed with the patient and are negative. Observations/Objective:     Assessment Plan:  Acute ITP ([place]) ITP secondary to SLE Prior treatment: 1.  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  She had a bone marrow biopsy there and was treated with IVIG prednisone and rituximab. 2.  Frequent relapses every 6 months.  She required IVIG prednisone and Rituxan. 3.  Moved to [place] and relapsed 07/06/2015, 02/27/2016 at [facility] 02/27/2016: 4 days of Decadron and IVIG and hydroxyurea for lupus (platelet count of 12) followed by Rituxan 07/06/2015: Hospitalization for ITP: Steroids with IVIG 4.  Patient investigated and found dapsone 100 mg daily which has been preventing relapses of ITP. 5.  10/24/2018: Platelets 49 when she ran out of dapsone. 6.  Relapse 01/27/2019: Dexamethasone went into remission 7.  05/17/2019: Relapse platelet count 17 8. Dexamethasone 4 mg daily with Promacta 50 mg daily  ------------------------------------------------------------------------------------------------------------------------------------------------------ Hospitalization: 04/24/20-04/28/20: Intra cerebral hemorrhage (platelets > 400K), No  surg intervention Angiograms: No aneurysms   History of iron deficiency anemia  12/30/2021: WBC 3.7, hemoglobin 11.2, MCV 70.7, platelets 45  11/18/2022: WBC:7, hemoglobin 8.4, MCV 64, RDW 16, platelets 24, ferritin 18  Relapsed ITP: Dexamethasone 4 mg daily x 5 days  Worsening anemia: I recommended giving her 1000 mg of Monoferric.  She lives in [place] and wishes to drive all the way to [facility] for her iron infusion.  Therefore we felt 1 dose of Monoferric would be better for her. When she comes for her iron infusion we will recheck labs We will also check for hemoglobin electrophoresis 6   I discussed the assessment and treatment plan with the patient. The patient was provided an opportunity to ask questions and all were answered. The patient agreed with the plan and demonstrated an understanding of the instructions. The patient was advised to call back or seek an in-person evaluation if the symptoms worsen or if the condition fails to improve as anticipated.   I provided 12 minutes of non-face-to-face time during this encounter.  This includes time for charting and coordination of care   Harriette Ohara, MD

## 2022-11-22 NOTE — Assessment & Plan Note (Signed)
ITP secondary to SLE Prior treatment: 1.  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  She had a bone marrow biopsy there and was treated with IVIG prednisone and rituximab. 2.  Frequent relapses every 6 months.  She required IVIG prednisone and Rituxan. 3.  Moved to [place] and relapsed 07/06/2015, 02/27/2016 at [facility] 02/27/2016: 4 days of Decadron and IVIG and hydroxyurea for lupus (platelet count of 12) followed by Rituxan 07/06/2015: Hospitalization for ITP: Steroids with IVIG 4.  Patient investigated and found dapsone 100 mg daily which has been preventing relapses of ITP. 5.  10/24/2018: Platelets 49 when she ran out of dapsone. 6.  Relapse 01/27/2019: Dexamethasone went into remission 7.  05/17/2019: Relapse platelet count 17 8. Dexamethasone 4 mg daily with Promacta 50 mg daily  ------------------------------------------------------------------------------------------------------------------------------------------------------ Hospitalization: 04/24/20-04/28/20: Intra cerebral hemorrhage (platelets > 400K), No surg intervention Angiograms: No aneurysms   History of iron deficiency anemia 12/30/2021: WBC 3.7, hemoglobin 11.2, MCV 70.7, platelets 45  Current treatment: Promacta maintenance Worsening anemia: I started her on oral iron therapy

## 2022-11-24 ENCOUNTER — Encounter: Payer: Self-pay | Admitting: Hematology and Oncology

## 2022-11-29 ENCOUNTER — Inpatient Hospital Stay: Payer: Self-pay | Admitting: Hematology and Oncology

## 2022-11-29 ENCOUNTER — Telehealth: Payer: Self-pay | Admitting: Hematology and Oncology

## 2022-11-29 NOTE — Telephone Encounter (Signed)
Left patient a vm regarding upcoming appointments  

## 2022-11-30 ENCOUNTER — Inpatient Hospital Stay: Payer: 59

## 2022-11-30 ENCOUNTER — Inpatient Hospital Stay: Payer: 59 | Admitting: Adult Health

## 2022-12-07 ENCOUNTER — Telehealth: Payer: Self-pay | Admitting: *Deleted

## 2022-12-07 NOTE — Telephone Encounter (Signed)
RN 2nd attempt to contact pt regarding paperwork for manufacture assistance for IV iron. RN was able to connect with pt on the phone.  Pt states she has received the email and would work on completing it and sending it back today. RN alerted IV drug Marketing executive.

## 2022-12-07 NOTE — Telephone Encounter (Signed)
Received message from PA team stating Monoferric has been denied by pt insurance.  RN attempt x1 to contact pt that we would need to proceed with submitting for manufacture assistance. No answer, LVM for pt to return call to [facility] with IV drug assistance to complete process to be submitted.

## 2022-12-09 ENCOUNTER — Telehealth: Payer: Self-pay | Admitting: Hematology and Oncology

## 2022-12-09 NOTE — Progress Notes (Signed)
Monoferric ordered by Dr. Lindi Adie denied by insurance and requires an appeal via P2P before drug assistance is possible. After discussing [employer] and provider availability, P2P scheduled for 12/13/22 at 1000 with [place], [place] aware of PA's contact at [phone].

## 2022-12-09 NOTE — Telephone Encounter (Signed)
Rescheduled appointment per staff message. Left voicemail. 

## 2022-12-12 ENCOUNTER — Inpatient Hospital Stay: Payer: 59

## 2022-12-12 ENCOUNTER — Inpatient Hospital Stay: Payer: 59 | Admitting: Hematology and Oncology

## 2022-12-14 ENCOUNTER — Other Ambulatory Visit: Payer: Self-pay | Admitting: Hematology and Oncology

## 2022-12-14 DIAGNOSIS — D500 Iron deficiency anemia secondary to blood loss (chronic): Secondary | ICD-10-CM

## 2022-12-14 DIAGNOSIS — D509 Iron deficiency anemia, unspecified: Secondary | ICD-10-CM | POA: Insufficient documentation

## 2022-12-20 ENCOUNTER — Telehealth: Payer: Self-pay | Admitting: Hematology and Oncology

## 2022-12-20 NOTE — Telephone Encounter (Signed)
Rescheduled appointments per patients request. Patient is aware of changes made to her upcoming appointments.

## 2022-12-27 ENCOUNTER — Inpatient Hospital Stay: Payer: 59

## 2022-12-27 ENCOUNTER — Inpatient Hospital Stay: Payer: 59 | Admitting: Adult Health

## 2023-01-03 ENCOUNTER — Inpatient Hospital Stay: Payer: 59

## 2023-01-03 ENCOUNTER — Encounter: Payer: Self-pay | Admitting: Adult Health

## 2023-01-03 ENCOUNTER — Inpatient Hospital Stay (HOSPITAL_BASED_OUTPATIENT_CLINIC_OR_DEPARTMENT_OTHER): Payer: 59 | Admitting: Adult Health

## 2023-01-03 ENCOUNTER — Inpatient Hospital Stay: Payer: 59 | Attending: Hematology and Oncology

## 2023-01-03 VITALS — BP 105/70 | HR 67 | Temp 97.7°F | Resp 20 | Ht <= 58 in | Wt 92.7 lb

## 2023-01-03 VITALS — BP 106/59 | HR 75 | Temp 98.1°F | Resp 16

## 2023-01-03 DIAGNOSIS — D509 Iron deficiency anemia, unspecified: Secondary | ICD-10-CM | POA: Insufficient documentation

## 2023-01-03 DIAGNOSIS — E611 Iron deficiency: Secondary | ICD-10-CM

## 2023-01-03 DIAGNOSIS — D500 Iron deficiency anemia secondary to blood loss (chronic): Secondary | ICD-10-CM

## 2023-01-03 DIAGNOSIS — D693 Immune thrombocytopenic purpura: Secondary | ICD-10-CM

## 2023-01-03 DIAGNOSIS — E559 Vitamin D deficiency, unspecified: Secondary | ICD-10-CM | POA: Insufficient documentation

## 2023-01-03 LAB — CBC WITH DIFFERENTIAL (CANCER CENTER ONLY)
Abs Immature Granulocytes: 0.01 10*3/uL (ref 0.00–0.07)
Basophils Absolute: 0 10*3/uL (ref 0.0–0.1)
Basophils Relative: 1 %
Eosinophils Absolute: 0.1 10*3/uL (ref 0.0–0.5)
Eosinophils Relative: 3 %
HCT: 31.9 % — ABNORMAL LOW (ref 36.0–46.0)
Hemoglobin: 8.7 g/dL — ABNORMAL LOW (ref 12.0–15.0)
Immature Granulocytes: 0 %
Lymphocytes Relative: 59 %
Lymphs Abs: 3.1 10*3/uL (ref 0.7–4.0)
MCH: 17.9 pg — ABNORMAL LOW (ref 26.0–34.0)
MCHC: 27.3 g/dL — ABNORMAL LOW (ref 30.0–36.0)
MCV: 65.5 fL — ABNORMAL LOW (ref 80.0–100.0)
Monocytes Absolute: 0.4 10*3/uL (ref 0.1–1.0)
Monocytes Relative: 8 %
Neutro Abs: 1.5 10*3/uL — ABNORMAL LOW (ref 1.7–7.7)
Neutrophils Relative %: 29 %
Platelet Count: 32 10*3/uL — ABNORMAL LOW (ref 150–400)
RBC: 4.87 MIL/uL (ref 3.87–5.11)
RDW: 17.8 % — ABNORMAL HIGH (ref 11.5–15.5)
WBC Count: 5.1 10*3/uL (ref 4.0–10.5)
nRBC: 0 % (ref 0.0–0.2)

## 2023-01-03 LAB — IRON AND IRON BINDING CAPACITY (CC-WL,HP ONLY)
Iron: 52 ug/dL (ref 28–170)
Saturation Ratios: 12 % (ref 10.4–31.8)
TIBC: 419 ug/dL (ref 250–450)
UIBC: 367 ug/dL (ref 148–442)

## 2023-01-03 MED ORDER — SODIUM CHLORIDE 0.9 % IV SOLN: Freq: Once | INTRAVENOUS | Status: AC

## 2023-01-03 MED ORDER — SODIUM CHLORIDE 0.9 % IV SOLN
1000.0000 mg | Freq: Once | INTRAVENOUS | Status: AC
  Administered 2023-01-03: 1000 mg via INTRAVENOUS
  Filled 2023-01-03: qty 10

## 2023-01-03 NOTE — Assessment & Plan Note (Signed)
Most recent ferritin was 18 she will proceed with Monoferric today.  We will repeat iron studies in 12 weeks and she will follow-up virtually with Dr. Pamelia Hoit to discuss the results.  Likely etiology is her heavy menstrual cycles however once she establishes with her primary care in [facility] she may want to undergo GI workup.

## 2023-01-03 NOTE — Patient Instructions (Signed)

## 2023-01-03 NOTE — Progress Notes (Signed)
[facility] Cancer Follow up:    [place], Buren Kos, MD [address] [place] [address]   DIAGNOSIS: ITP/iron deficiency anemia  SUMMARY OF HEMATOLOGIC HISTORY:   CURRENT THERAPY: monoferric today, on promacta and dexamethasone  INTERVAL HISTORY: Rebecca Bright 27 y.o. female returns for follow-up prior to receiving IV iron with Monoferric.  We were able to get this approved after several weeks of requesting it with her insurance company.  She has heavy menstrual cycles once about every 28-30 days, typically around the 8th of every month, ultra tampons-3-4 on day 1, 3-4 on day 2, day 3 2 ultra, day 4 super plus and regular tampon, day 5 super plus, maybe a pad, last day regular tampon She moved to [place], [place] and is currently a wait list for PCP in order to receive a referral to specialists in [place] including gynecology, rheumatology, and hematology.    Patient Active Problem List   Diagnosis Date Noted   Vitamin D deficiency, unspecified 01/03/2023   Iron deficiency anemia, unspecified 12/14/2022   Headache 01/03/2022   Breast lump in female 12/03/2020   Lupus 04/27/2020   Iron deficiency 04/27/2020   Intracranial edema 04/24/2020   ICH (intracerebral hemorrhage) 04/24/2020   Intraparenchymal hematoma of brain 04/24/2020   Acute ITP 11/15/2018    is allergic to sulfa antibiotics.  MEDICAL HISTORY: Past Medical History:  Diagnosis Date   History of ITP    Lupus    Stroke    bleed/04/24/20    SURGICAL HISTORY: Past Surgical History:  Procedure Laterality Date   HERNIA REPAIR     IR ANGIO EXTERNAL CAROTID SEL EXT CAROTID UNI R MOD SED  04/27/2020   IR ANGIO INTRA EXTRACRAN SEL INTERNAL CAROTID BILAT MOD SED  04/27/2020   IR ANGIO VERTEBRAL SEL VERTEBRAL UNI R MOD SED  04/27/2020   IR US GUIDE VASC ACCESS RIGHT  04/27/2020    SOCIAL HISTORY: Social History   Socioeconomic History   Marital status: Single    Spouse name: Not on  file   Number of children: Not on file   Years of education: Not on file   Highest education level: Not on file  Occupational History   Occupation: Full time student  Tobacco Use   Smoking status: Never   Smokeless tobacco: Never  Vaping Use   Vaping Use: Never used  Substance and Sexual Activity   Alcohol use: Never   Drug use: Never   Sexual activity: Not on file  Other Topics Concern   Not on file  Social History Narrative   Lives alone   Right handed   Drinks 2-3 cups weekly   Social Determinants of Health   Financial Resource Strain: Not on file  Food Insecurity: Not on file  Transportation Needs: Not on file  Physical Activity: Not on file  Stress: Not on file  Social Connections: Not on file  Intimate Partner Violence: Not on file    FAMILY HISTORY: Family History  Problem Relation Age of Onset   Hypertension Mother    Diabetes Father    High Cholesterol Father    Breast cancer Paternal Grandmother        15s at diagnosis    Review of Systems  Constitutional:  Positive for fatigue. Negative for appetite change, chills, fever and unexpected weight change.  HENT:   Negative for hearing loss, lump/mass and trouble swallowing.   Eyes:  Negative for eye problems and icterus.  Respiratory:  Negative for chest tightness, cough and shortness of breath.   Cardiovascular:  Negative for chest pain, leg swelling and palpitations.  Gastrointestinal:  Negative for abdominal distention, abdominal pain, constipation, diarrhea, nausea and vomiting.  Endocrine: Negative for hot flashes.  Genitourinary:  Negative for difficulty urinating.   Musculoskeletal:  Negative for arthralgias.  Skin:  Negative for itching and rash.  Neurological:  Negative for dizziness, extremity weakness, headaches and numbness.  Hematological:  Negative for adenopathy. Does not bruise/bleed easily.  Psychiatric/Behavioral:  Negative for depression. The patient is not nervous/anxious.        PHYSICAL EXAMINATION    Vitals:   01/03/23 1351  BP: 105/70  Pulse: 67  Resp: 20  Temp: 97.7 F (36.5 C)    Physical Exam Constitutional:      General: She is not in acute distress.    Appearance: Normal appearance. She is not toxic-appearing.  HENT:     Head: Normocephalic and atraumatic.  Eyes:     General: No scleral icterus. Cardiovascular:     Rate and Rhythm: Normal rate and regular rhythm.     Pulses: Normal pulses.     Heart sounds: Normal heart sounds.  Pulmonary:     Effort: Pulmonary effort is normal.     Breath sounds: Normal breath sounds.  Abdominal:     General: Abdomen is flat. Bowel sounds are normal. There is no distension.     Palpations: Abdomen is soft.     Tenderness: There is no abdominal tenderness.  Musculoskeletal:        General: No swelling.     Cervical back: Neck supple.  Lymphadenopathy:     Cervical: No cervical adenopathy.  Skin:    General: Skin is warm and dry.     Findings: No rash.  Neurological:     General: No focal deficit present.     Mental Status: She is alert.  Psychiatric:        Mood and Affect: Mood normal.        Behavior: Behavior normal.     LABORATORY DATA:  CBC    Component Value Date/Time   WBC 4.1 01/13/2022 1428   WBC 3.7 (L) 12/30/2021 1718   RBC 4.76 01/13/2022 1428   HGB 9.9 (L) 01/13/2022 1428   HCT 33.4 (L) 01/13/2022 1428   PLT 155 01/13/2022 1428   MCV 70.2 (L) 01/13/2022 1428   MCH 20.8 (L) 01/13/2022 1428   MCHC 29.6 (L) 01/13/2022 1428   RDW 16.7 (H) 01/13/2022 1428   LYMPHSABS 2.1 01/13/2022 1428   MONOABS 0.3 01/13/2022 1428   EOSABS 0.1 01/13/2022 1428   BASOSABS 0.0 01/13/2022 1428    CMP     Component Value Date/Time   NA 136 12/30/2021 1718   K 3.7 12/30/2021 1718   CL 108 12/30/2021 1718   CO2 25 12/30/2021 1718   GLUCOSE 89 12/30/2021 1718   BUN 10 12/30/2021 1718   CREATININE 0.58 12/30/2021 1718   CALCIUM 8.9 12/30/2021 1718   PROT 9.7 (H) 04/24/2020 1144    ALBUMIN 4.4 04/24/2020 1144   AST 20 04/24/2020 1144   ALT 14 04/24/2020 1144   ALKPHOS 58 04/24/2020 1144   BILITOT <0.1 (L) 04/24/2020 1144   GFRNONAA >60 12/30/2021 1718   GFRAA >60 04/24/2020 1144      ASSESSMENT and THERAPY PLAN:   Iron deficiency anemia, unspecified Most recent ferritin was 18 she will proceed with Monoferric today.  We will repeat iron studies in  12 weeks and she will follow-up virtually with Dr. Pamelia Hoit to discuss the results.  Likely etiology is her heavy menstrual cycles however once she establishes with her primary care in [facility] she may want to undergo GI workup.  All questions were answered. The patient knows to call the clinic with any problems, questions or concerns. We can certainly see the patient much sooner if necessary.  Total encounter time:20 minutes*in face-to-face visit time, chart review, lab review, care coordination, order entry, and documentation of the encounter time.  Rebecca Anes, NP 01/03/23 2:22 PM Medical Oncology and Hematology [facility] [address] [place], [place] [address] Tel. [phone]    Fax. [phone]  *Total Encounter Time as defined by the Centers for Medicare and Medicaid Services includes, in addition to the face-to-face time of a patient visit (documented in the note above) non-face-to-face time: obtaining and reviewing outside history, ordering and reviewing medications, tests or procedures, care coordination (communications with other health care professionals or caregivers) and documentation in the medical record.

## 2023-01-05 ENCOUNTER — Telehealth: Payer: Self-pay | Admitting: Adult Health

## 2023-01-05 NOTE — Telephone Encounter (Signed)
Scheduled appointment per 4/23 los. Patient is aware of the made appointment. 

## 2023-01-06 LAB — HGB FRACTIONATION BY HPLC
Hgb A: 98 % (ref 96.4–98.8)
Hgb C: 0 %
Hgb E: 0 %
Hgb F: 0 % (ref 0.0–2.0)
Hgb S: 0 %
Hgb Variant: 0.9 % — ABNORMAL HIGH

## 2023-01-06 LAB — HGB FRACTIONATION CASCADE: Hgb A2: 1.1 % — ABNORMAL LOW (ref 1.8–3.2)

## 2023-02-13 ENCOUNTER — Encounter: Payer: Self-pay | Admitting: *Deleted

## 2023-02-13 NOTE — Progress Notes (Signed)
Received call from Shauna [phone]) with [facility] in [place] stating pt is currently hospitalized with hematuria and needing hospital f/u with MD.  Appt scheduled, Shauna notified and states she will alert pt of appt details.

## 2023-02-15 NOTE — Progress Notes (Signed)
Patient Care Team: [place], Buren Kos, MD as PCP - General (Family Medicine)  DIAGNOSIS:  Encounter Diagnoses  Name Primary?   Iron deficiency anemia due to chronic blood loss Yes   Acute ITP (HCC)       CHIEF COMPLIANT: Follow-up ITP  INTERVAL HISTORY: Rebecca Bright is a 27 year old above-mentioned history of ITP who is currently being managed with Promacta. She presents to the clinic for a follow-up after recent hospitalization for acute ITP with bleeding gums as well as hematuria.  She received steroids and IVIG and 2 units of platelets.  Upon discharge her platelet count was 26.  Today her platelet count is 32.  She was restarted back on Plaquenil for lupus.   ALLERGIES:  is allergic to sulfa antibiotics.  MEDICATIONS:  Current Outpatient Medications  Medication Sig Dispense Refill   hydroxychloroquine (PLAQUENIL) 200 MG tablet Take 1 tablet (200 mg total) by mouth daily. 30 tablet 0   eltrombopag (PROMACTA) 75 MG tablet Take 1 tablet (75 mg total) by mouth daily. TAKE 1 TABLET BY MOUTH ONCE DAILY ON AN EMPTY STOMACH  AT LEAST 1 HOUR BEFORE OR 2 HOURS AFTER A MEAL 30 tablet 11   No current facility-administered medications for this visit.    PHYSICAL EXAMINATION: ECOG PERFORMANCE STATUS: 1 - Symptomatic but completely ambulatory  Vitals:   02/28/23 0953  BP: 117/79  Pulse: 82  Resp: 18  Temp: (!) 97.3 F (36.3 C)  SpO2: 100%   Filed Weights   02/28/23 0953  Weight: 93 lb 12.8 oz (42.5 kg)     LABORATORY DATA:  I have reviewed the data as listed    Latest Ref Rng & Units 12/30/2021    5:18 PM 04/24/2020   11:44 AM  CMP  Glucose 70 - 99 mg/dL 89  161   BUN 6 - 20 mg/dL 10  10   Creatinine 0.96 - 1.00 mg/dL 0.45  4.09   Sodium 811 - 145 mmol/L 136  136   Potassium 3.5 - 5.1 mmol/L 3.7  3.5   Chloride 98 - 111 mmol/L 108  103   CO2 22 - 32 mmol/L 25  24   Calcium 8.9 - 10.3 mg/dL 8.9  9.2   Total Protein 6.5 - 8.1 g/dL  9.7   Total Bilirubin 0.3 -  1.2 mg/dL  <9.1   Alkaline Phos 38 - 126 U/L  58   AST 15 - 41 U/L  20   ALT 0 - 44 U/L  14     Lab Results  Component Value Date   WBC 5.4 02/28/2023   HGB 12.4 02/28/2023   HCT 39.5 02/28/2023   MCV 77.9 (L) 02/28/2023   PLT 32 (L) 02/28/2023   NEUTROABS 2.0 02/28/2023    ASSESSMENT & PLAN:  Iron deficiency anemia, unspecified 12/30/2021: WBC 3.7, hemoglobin 11.2, MCV 70.7, platelets 45  11/18/2022: WBC:7, hemoglobin 8.4, MCV 64, RDW 16, platelets 24, ferritin 18 (IV iron Monoferric 01/03/2023) 02/13/2023: WBC 4, hemoglobin 11.2, MCV 73.2, platelets 26, immature platelet fraction 23.4%  Acute ITP (HCC) ITP secondary to SLE Prior treatment: 1.  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  She had a bone marrow biopsy there and was treated with IVIG prednisone and rituximab. 2.  Frequent relapses every 6 months.  She required IVIG prednisone and Rituxan. 3.  Moved to [place] and relapsed 07/06/2015, 02/27/2016 at [facility] 02/27/2016: 4 days of Decadron and IVIG and hydroxyurea for lupus (platelet count of 12) followed by Rituxan 07/06/2015: Hospitalization for ITP: Steroids with IVIG 4.  Patient investigated and found dapsone 100 mg daily which has been preventing relapses of ITP. 5.  10/24/2018: Platelets 49 when she ran out of dapsone. 6.  Relapse 01/27/2019: Dexamethasone went into remission 7.  05/17/2019: Relapse platelet count 17 8. Dexamethasone 4 mg daily with Promacta 50 mg daily  ------------------------------------------------------------------------------------------------------------------------------------------------------ Hospitalization: 04/24/20-04/28/20: Intra cerebral hemorrhage (platelets > 400K), No surg intervention Angiograms: No aneurysms  Relapsed ITP: 01/13/2022: Platelets 155 01/03/2023: Platelets 32, hemoglobin electrophoresis: Hemoglobin A 98%, hemoglobin A2 delta  chain variant 0.9% (not clinically significant): Treated with 5 days of dexamethasone p.o. 02/13/2023: Platelets 26 02/28/2023: Platelets 32  Recommendation: Increasing the dosage of Promacta to 75 mg a day. I discussed with her the other options were Tavalisse, Rituxan as well as Nplate.  (Patient tells me that she was intolerant of Rituxan)  She will get weekly CBC with differential at [facility] and I will talk to her in 2 weeks to see if she is getting better. Lupus: We will refer her to [facility] lupus clinic in [place].  No orders of the defined types were placed in this encounter.  The patient has a good understanding of the overall plan. she agrees with it. she will call with any problems that may develop before the next visit here. Total time spent: 30 mins including face to face time and time spent for planning, charting and co-ordination of care   Tamsen Meek, MD 02/28/23    I Janan Ridge am acting as a Neurosurgeon for [employer]  I have reviewed the above documentation for accuracy and completeness, and I agree with the above.

## 2023-02-22 IMAGING — CT CT ANGIO HEAD
2 of 11 series · 7 of 33 positions shown · non-contrast
Comparison: CT and MRI head and MRA from January 23, 2020.

CLINICAL DATA: Headache, chronic, new features or increased
frequency; Neck trauma, arterial injury suspected

EXAM:
CT ANGIOGRAPHY HEAD AND NECK
TECHNIQUE: Multidetector CT imaging of the head and neck was performed using
the standard protocol during bolus administration of intravenous
contrast. Multiplanar CT image reconstructions and MIPs were
obtained to evaluate the vascular anatomy. Carotid stenosis
measurements (when applicable) are obtained utilizing NASCET
criteria, using the distal internal carotid diameter as the
denominator.

[Series 507: cta head neck thins · axial · 0.39mm/px · z∈[+1602,+1800]mm · 5 of 595 slices shown]
[im 100/595  soft-tissue]
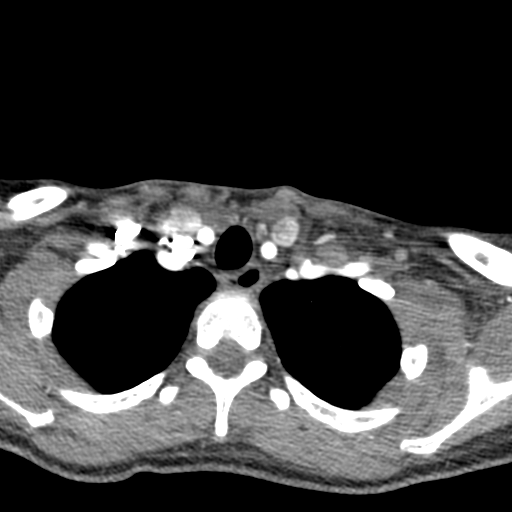
[im 199/595  bone]
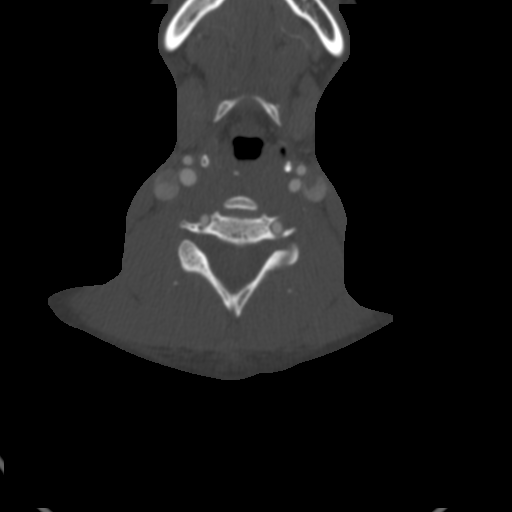
[im 298/595  soft-tissue]
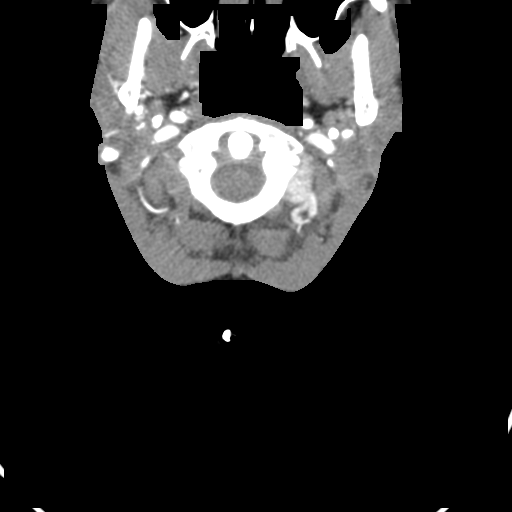
[im 397/595  bone]
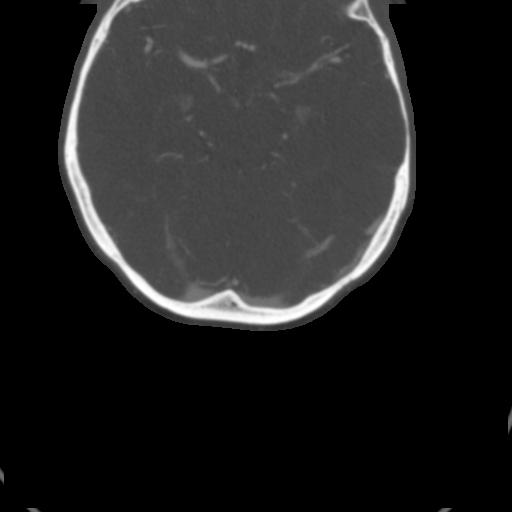
[im 496/595  soft-tissue]
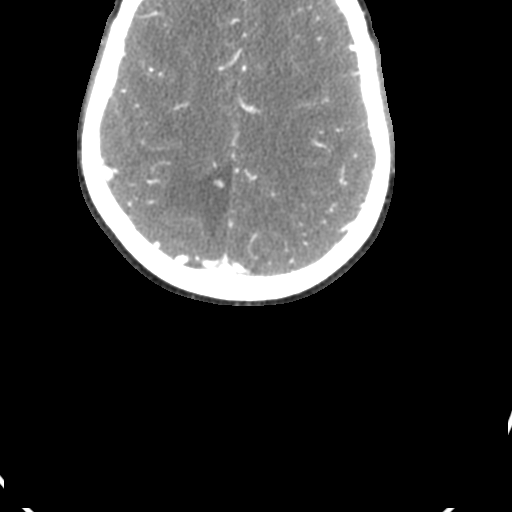

[Series 508: ax thin · axial · 0.39mm/px · z∈[+1649,+1745]mm · 2 of 290 slices shown]
[im 97/290  soft-tissue]
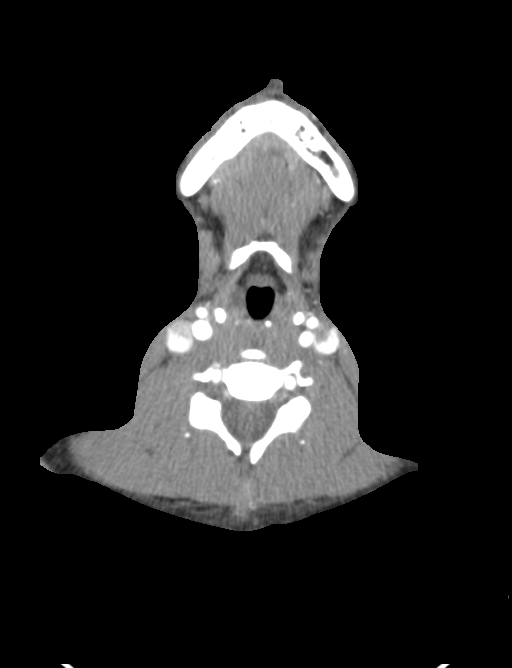
[im 193/290  soft-tissue]
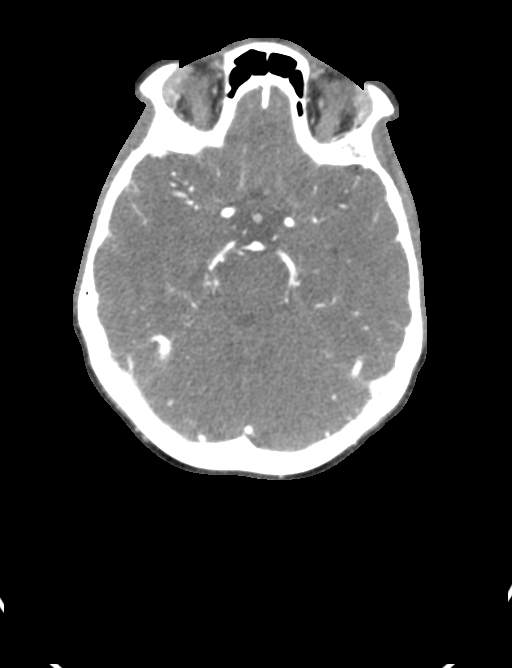

[7 of 33 positions shown; findings below may reference images not displayed]

RADIATION DOSE REDUCTION: This exam was performed according to the
departmental dose-optimization program which includes automated
exposure control, adjustment of the mA and/or kV according to
patient size and/or use of iterative reconstruction technique.

CONTRAST:  100mL OMNIPAQUE IOHEXOL 350 MG/ML SOLN
FINDINGS: CT HEAD FINDINGS

Brain: Area of encephalomalacia and mineralization in the high right
parasagittal parietal lobe, compatible with sequela of known prior
hemorrhage. No evidence of acute hemorrhage, acute large vascular
territory infarct, mass lesion, midline shift, or hydrocephalus.

Vascular: Detailed below.

Skull: No acute fracture.

Sinuses: Visualized sinuses are largely clear. No acute orbital
findings.

Other: No mastoid effusions.

Review of the MIP images confirms the above findings

CTA NECK FINDINGS

Aortic arch: Great vessel origins are patent without significant
stenosis.

Right carotid system: No evidence of dissection, stenosis (50% or
greater) or occlusion.

Left carotid system: No evidence of dissection, stenosis (50% or
greater) or occlusion.

Vertebral arteries: Mildly left dominant. No evidence of dissection,
stenosis (50% or greater) or occlusion.

Skeleton: No acute finding.

Other neck: No acute finding.

Upper chest: Visualized lung apices are clear.

Review of the MIP images confirms the above findings

CTA HEAD FINDINGS

Anterior circulation: Bilateral intracranial ICAs, MCAs, and ACAs
are patent without proximal hemodynamically significant stenosis no
aneurysm identified.

Posterior circulation: Bilateral intradural vertebral arteries,
basilar artery and posterior cerebral arteries are patent without
proximal hemodynamically significant stenosis. No aneurysm
identified.

Venous sinuses: No evidence of dural venous sinus thrombosis.

Review of the MIP images confirms the above findings
IMPRESSION: CT head:

1. No evidence of acute intracranial abnormality.
2. Sequela of prior hemorrhage in the high right parasagittal
parietal lobe.

CTA head/neck:

Normal study. No large vessel occlusion or proximal hemodynamically
significant stenosis in the head or neck.

## 2023-02-28 ENCOUNTER — Inpatient Hospital Stay: Payer: 59 | Attending: Hematology and Oncology | Admitting: Hematology and Oncology

## 2023-02-28 ENCOUNTER — Other Ambulatory Visit: Payer: Self-pay

## 2023-02-28 ENCOUNTER — Inpatient Hospital Stay: Payer: 59

## 2023-02-28 ENCOUNTER — Other Ambulatory Visit: Payer: Self-pay | Admitting: *Deleted

## 2023-02-28 VITALS — BP 117/79 | HR 82 | Temp 97.3°F | Resp 18 | Ht <= 58 in | Wt 93.8 lb

## 2023-02-28 DIAGNOSIS — D509 Iron deficiency anemia, unspecified: Secondary | ICD-10-CM | POA: Diagnosis present

## 2023-02-28 DIAGNOSIS — D500 Iron deficiency anemia secondary to blood loss (chronic): Secondary | ICD-10-CM

## 2023-02-28 DIAGNOSIS — Z79899 Other long term (current) drug therapy: Secondary | ICD-10-CM | POA: Diagnosis not present

## 2023-02-28 DIAGNOSIS — D693 Immune thrombocytopenic purpura: Secondary | ICD-10-CM

## 2023-02-28 DIAGNOSIS — M329 Systemic lupus erythematosus, unspecified: Secondary | ICD-10-CM

## 2023-02-28 LAB — CBC WITH DIFFERENTIAL (CANCER CENTER ONLY)
Abs Immature Granulocytes: 0.01 10*3/uL (ref 0.00–0.07)
Basophils Absolute: 0.1 10*3/uL (ref 0.0–0.1)
Basophils Relative: 1 %
Eosinophils Absolute: 0.1 10*3/uL (ref 0.0–0.5)
Eosinophils Relative: 3 %
HCT: 39.5 % (ref 36.0–46.0)
Hemoglobin: 12.4 g/dL (ref 12.0–15.0)
Immature Granulocytes: 0 %
Lymphocytes Relative: 51 %
Lymphs Abs: 2.8 10*3/uL (ref 0.7–4.0)
MCH: 24.5 pg — ABNORMAL LOW (ref 26.0–34.0)
MCHC: 31.4 g/dL (ref 30.0–36.0)
MCV: 77.9 fL — ABNORMAL LOW (ref 80.0–100.0)
Monocytes Absolute: 0.4 10*3/uL (ref 0.1–1.0)
Monocytes Relative: 7 %
Neutro Abs: 2 10*3/uL (ref 1.7–7.7)
Neutrophils Relative %: 38 %
Platelet Count: 32 10*3/uL — ABNORMAL LOW (ref 150–400)
RBC: 5.07 MIL/uL (ref 3.87–5.11)
RDW: 22.1 % — ABNORMAL HIGH (ref 11.5–15.5)
WBC Count: 5.4 10*3/uL (ref 4.0–10.5)
nRBC: 0 % (ref 0.0–0.2)

## 2023-02-28 MED ORDER — ELTROMBOPAG OLAMINE 75 MG PO TABS
75.0000 mg | ORAL_TABLET | Freq: Every day | ORAL | 11 refills | Status: AC
Start: 2023-02-28 — End: ?

## 2023-02-28 NOTE — Assessment & Plan Note (Signed)
ITP secondary to SLE Prior treatment: 1.  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  She had a bone marrow biopsy there and was treated with IVIG prednisone and rituximab. 2.  Frequent relapses every 6 months.  She required IVIG prednisone and Rituxan. 3.  Moved to [place] and relapsed 07/06/2015, 02/27/2016 at [facility] 02/27/2016: 4 days of Decadron and IVIG and hydroxyurea for lupus (platelet count of 12) followed by Rituxan 07/06/2015: Hospitalization for ITP: Steroids with IVIG 4.  Patient investigated and found dapsone 100 mg daily which has been preventing relapses of ITP. 5.  10/24/2018: Platelets 49 when she ran out of dapsone. 6.  Relapse 01/27/2019: Dexamethasone went into remission 7.  05/17/2019: Relapse platelet count 17 8. Dexamethasone 4 mg daily with Promacta 50 mg daily  ------------------------------------------------------------------------------------------------------------------------------------------------------ Hospitalization: 04/24/20-04/28/20: Intra cerebral hemorrhage (platelets > 400K), No surg intervention Angiograms: No aneurysms  Relapsed ITP: 01/13/2022: Platelets 155 01/03/2023: Platelets 32, hemoglobin electrophoresis: Hemoglobin A 98%, hemoglobin A2 delta chain variant 0.9% (not clinically significant): Treated with 5 days of dexamethasone p.o. 02/13/2023: Platelets 26  Recommendation: Increasing the dosage of Promacta

## 2023-02-28 NOTE — Progress Notes (Signed)
Per MD request RN successfully faxed referral to [facility] [phone]).

## 2023-02-28 NOTE — Assessment & Plan Note (Signed)
12/30/2021: WBC 3.7, hemoglobin 11.2, MCV 70.7, platelets 45  11/18/2022: WBC:7, hemoglobin 8.4, MCV 64, RDW 16, platelets 24, ferritin 18 (IV iron Monoferric 01/03/2023) 02/13/2023: WBC 4, hemoglobin 11.2, MCV 73.2, platelets 26, immature platelet fraction 23.4%

## 2023-03-02 ENCOUNTER — Encounter: Payer: Self-pay | Admitting: *Deleted

## 2023-03-02 NOTE — Progress Notes (Signed)
Received [facility] Referral Form for [facility].  RN successfully completed and faxed form to [phone].

## 2023-03-03 ENCOUNTER — Telehealth: Payer: Self-pay | Admitting: Hematology and Oncology

## 2023-03-03 NOTE — Telephone Encounter (Signed)
Scheduled appointment per los. Patient is aware of the made appointment. 

## 2023-03-23 NOTE — Progress Notes (Signed)
HEMATOLOGY-ONCOLOGY TELEPHONE VISIT PROGRESS NOTE  I connected with our patient on 03/24/23 at  9:45 AM EDT by telephone and verified that I am speaking with the correct person using two identifiers.  I discussed the limitations, risks, security and privacy concerns of performing an evaluation and management service by telephone and the availability of in person appointments.  I also discussed with the patient that there may be a patient responsible charge related to this service. The patient expressed understanding and agreed to proceed.   History of Present Illness: Rebecca Bright is a 27 year old above-mentioned history of ITP who is currently being managed with Promacta. She presents to the clinic for a telephone follow-up to lab results.  She tells me that she was admitted to the hospital with abdominal pain was found to have bleeding which was initially thought to be related to an ovarian cyst rupture.  However she was evaluated by hematology and they recommended a splenectomy simultaneously.  This is because of platelets and the time of the hospital were only 14.  Subsequent to splenectomy the platelets went up to 710 and yesterday her platelets were in the 400s.  She is going to be seen by hematology closer to home.    REVIEW OF SYSTEMS:   Constitutional: Denies fevers, chills or abnormal weight loss All other systems were reviewed with the patient and are negative. 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  She had a bone marrow  biopsy there and was treated with IVIG prednisone and rituximab. 2.  Frequent relapses every 6 months.  She required IVIG prednisone and Rituxan. 3.  Moved to [place] and relapsed 07/06/2015, 02/27/2016 at [facility] 02/27/2016: 4 days of Decadron and IVIG and hydroxyurea for lupus (platelet count of 12) followed by Rituxan 07/06/2015: Hospitalization for ITP: Steroids with IVIG 4.  Patient investigated and found dapsone 100 mg daily which has been preventing relapses of ITP. 5.  10/24/2018: Platelets 49 when she ran out of dapsone. 6.  Relapse 01/27/2019: Dexamethasone went into remission 7.  05/17/2019: Relapse platelet count 17 8. Dexamethasone 4 mg daily with Promacta 50 mg daily  ------------------------------------------------------------------------------------------------------------------------------------------------------ Hospitalization: 04/24/20-04/28/20: Intra cerebral hemorrhage (platelets > 400K), No surg intervention Angiograms: No aneurysms   Relapsed ITP: 01/13/2022: Platelets 155 01/03/2023: Platelets 32, hemoglobin electrophoresis: Hemoglobin A 98%, hemoglobin A2 delta chain variant 0.9% (not clinically significant): Treated with 5 days of dexamethasone p.o. 02/13/2023: Platelets 26 02/28/2023: Platelets 32 (Promacta dose increased to 75) 03/12/2023: Platelets 710 (recommend reducing the Promacta dose back to 50 mg): Status post penectomy 03/07/2023 Patient will follow with her hematologist closer to home.   We are available to consult on an as-needed basis.   I discussed the assessment and treatment plan with the patient. The patient was provided an opportunity to ask questions and all were answered. The patient agreed with the plan and demonstrated an understanding of the instructions. The patient was advised to call back or seek an in-person evaluation if the symptoms worsen or if the condition fails to improve as anticipated.  I provided 12 minutes of  non-face-to-face time during this encounter.  This includes time for charting and coordination of care   Tamsen Meek, MD  I Janan Ridge am acting as a scribe for Dr.Shakeisha Horine  I have reviewed the above documentation for accuracy and completeness, and I agree with the above.

## 2023-03-24 ENCOUNTER — Inpatient Hospital Stay: Payer: 59 | Attending: Hematology and Oncology | Admitting: Hematology and Oncology

## 2023-03-24 DIAGNOSIS — D693 Immune thrombocytopenic purpura: Secondary | ICD-10-CM | POA: Diagnosis not present

## 2023-03-24 NOTE — Assessment & Plan Note (Signed)
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  She had a bone marrow biopsy there and was treated with IVIG prednisone and rituximab. 2.  Frequent relapses every 6 months.  She required IVIG prednisone and Rituxan. 3.  Moved to [place] and relapsed 07/06/2015, 02/27/2016 at [facility] 02/27/2016: 4 days of Decadron and IVIG and hydroxyurea for lupus (platelet count of 12) followed by Rituxan 07/06/2015: Hospitalization for ITP: Steroids with IVIG 4.  Patient investigated and found dapsone 100 mg daily which has been preventing relapses of ITP. 5.  10/24/2018: Platelets 49 when she ran out of dapsone. 6.  Relapse 01/27/2019: Dexamethasone went into remission 7.  05/17/2019: Relapse platelet count 17 8. Dexamethasone 4 mg daily with Promacta 50 mg daily  ------------------------------------------------------------------------------------------------------------------------------------------------------ Hospitalization: 04/24/20-04/28/20: Intra cerebral hemorrhage (platelets > 400K), No surg intervention Angiograms: No aneurysms   Relapsed ITP: 01/13/2022: Platelets 155 01/03/2023: Platelets 32, hemoglobin electrophoresis: Hemoglobin A 98%, hemoglobin A2 delta chain variant 0.9% (not clinically significant): Treated with 5 days of dexamethasone p.o. 02/13/2023: Platelets 26 02/28/2023: Platelets 32 (Promacta dose increased to 75) 03/12/2023: Platelets 710 (recommend reducing the Promacta dose back to 50 mg)  Lab check again in 2 weeks

## 2023-03-28 ENCOUNTER — Inpatient Hospital Stay: Payer: 59 | Admitting: Hematology and Oncology

## 2023-03-29 ENCOUNTER — Encounter: Payer: Self-pay | Admitting: Hematology and Oncology

## 2023-03-31 ENCOUNTER — Telehealth: Payer: Self-pay | Admitting: *Deleted

## 2023-03-31 NOTE — Telephone Encounter (Signed)
Returned call to [facility] [phone].VM stated report of critical lab value. Ref# [id number] Test completed at [facility] in [place], [place]  Destiny P, rep confirmed patient information and confirmed ordering provider and asked to speak with provider. Informed provider not in office at this time. She stated [facility] protocol was give lab value to provider office if provider not available and then contact provider directly.  Result reported - PLT less than 5.  Provided contact info for Oncologist on call/Dr. Pamelia Hoit. Destiny P stated she would contact Dr. Pamelia Hoit with results

## 2023-04-03 ENCOUNTER — Encounter: Payer: Self-pay | Admitting: Hematology and Oncology

## 2023-04-12 ENCOUNTER — Encounter: Payer: Self-pay | Admitting: Hematology and Oncology

## 2023-04-12 NOTE — Progress Notes (Signed)
This encounter was created in error - please disregard.
